# Patient Record
Sex: Male | Born: 1945
Health system: Southern US, Community
[De-identification: ages and names within clinical notes are randomized; demographics above are authoritative.]

## PROBLEM LIST (undated history)

## (undated) DIAGNOSIS — I2510 Atherosclerotic heart disease of native coronary artery without angina pectoris: Secondary | ICD-10-CM

## (undated) DIAGNOSIS — I639 Cerebral infarction, unspecified: Secondary | ICD-10-CM

## (undated) DIAGNOSIS — G709 Myoneural disorder, unspecified: Secondary | ICD-10-CM

## (undated) DIAGNOSIS — E785 Hyperlipidemia, unspecified: Secondary | ICD-10-CM

## (undated) DIAGNOSIS — M1990 Unspecified osteoarthritis, unspecified site: Secondary | ICD-10-CM

## (undated) DIAGNOSIS — K219 Gastro-esophageal reflux disease without esophagitis: Secondary | ICD-10-CM

## (undated) DIAGNOSIS — I10 Essential (primary) hypertension: Secondary | ICD-10-CM

## (undated) HISTORY — DX: Atherosclerotic heart disease of native coronary artery without angina pectoris: I25.10

## (undated) HISTORY — DX: Hyperlipidemia, unspecified: E78.5

## (undated) HISTORY — PX: CORONARY ANGIOPLASTY: SHX604

## (undated) HISTORY — PX: KNEE ARTHROSCOPY: SHX127

## (undated) HISTORY — DX: Essential (primary) hypertension: I10

## (undated) HISTORY — PX: CARDIAC CATHETERIZATION: SHX172

## (undated) HISTORY — PX: FOOT SURGERY: SHX648

---

## 2006-03-07 ENCOUNTER — Ambulatory Visit: Payer: Self-pay | Admitting: Cardiovascular Disease

## 2006-03-11 ENCOUNTER — Ambulatory Visit (HOSPITAL_COMMUNITY): Admission: RE | Admit: 2006-03-11 | Discharge: 2006-03-12 | Payer: Self-pay | Admitting: Cardiovascular Disease

## 2006-03-11 ENCOUNTER — Ambulatory Visit: Payer: Self-pay | Admitting: Cardiovascular Disease

## 2006-03-20 ENCOUNTER — Ambulatory Visit: Payer: Self-pay | Admitting: Cardiovascular Disease

## 2006-09-20 ENCOUNTER — Ambulatory Visit: Payer: Self-pay | Admitting: Cardiovascular Disease

## 2007-01-06 ENCOUNTER — Ambulatory Visit: Payer: Self-pay

## 2007-01-06 ENCOUNTER — Ambulatory Visit: Payer: Self-pay | Admitting: Cardiovascular Disease

## 2007-03-03 ENCOUNTER — Ambulatory Visit: Payer: Self-pay | Admitting: Cardiovascular Disease

## 2007-03-03 LAB — CONVERTED CEMR LAB
ALT: 33 units/L (ref 0–53)
Albumin: 4.3 g/dL (ref 3.5–5.2)
Cholesterol: 238 mg/dL (ref 0–200)
Direct LDL: 142.9 mg/dL
Total Bilirubin: 1 mg/dL (ref 0.3–1.2)
Total CK: 58 units/L (ref 7–195)
Total Protein: 8.3 g/dL (ref 6.0–8.3)
Triglycerides: 129 mg/dL (ref 0–149)
VLDL: 26 mg/dL (ref 0–40)

## 2007-03-17 ENCOUNTER — Ambulatory Visit: Payer: Self-pay | Admitting: Podiatry

## 2007-03-21 ENCOUNTER — Ambulatory Visit: Payer: Self-pay | Admitting: Podiatry

## 2007-07-22 ENCOUNTER — Ambulatory Visit: Payer: Self-pay | Admitting: Cardiovascular Disease

## 2007-07-22 LAB — CONVERTED CEMR LAB
ALT: 23 units/L (ref 0–53)
AST: 25 units/L (ref 0–37)
Albumin: 3.9 g/dL (ref 3.5–5.2)
Total CHOL/HDL Ratio: 6.1
Total Protein: 7.5 g/dL (ref 6.0–8.3)
VLDL: 34 mg/dL (ref 0–40)

## 2007-07-24 ENCOUNTER — Ambulatory Visit: Payer: Self-pay | Admitting: Cardiovascular Disease

## 2008-02-27 ENCOUNTER — Ambulatory Visit: Payer: Self-pay | Admitting: Cardiovascular Disease

## 2008-02-27 LAB — CONVERTED CEMR LAB
ALT: 18 units/L (ref 0–53)
Cholesterol: 272 mg/dL (ref 0–200)
Total Protein: 7.4 g/dL (ref 6.0–8.3)
VLDL: 17 mg/dL (ref 0–40)

## 2008-03-03 ENCOUNTER — Ambulatory Visit: Payer: Self-pay

## 2008-03-03 ENCOUNTER — Ambulatory Visit: Payer: Self-pay | Admitting: Cardiovascular Disease

## 2008-06-04 ENCOUNTER — Ambulatory Visit: Payer: Self-pay | Admitting: Cardiovascular Disease

## 2008-06-04 LAB — CONVERTED CEMR LAB
Alkaline Phosphatase: 45 units/L (ref 39–117)
Total Bilirubin: 0.8 mg/dL (ref 0.3–1.2)
Total CHOL/HDL Ratio: 4.1
Total Protein: 7.9 g/dL (ref 6.0–8.3)
Triglycerides: 97 mg/dL (ref 0–149)

## 2008-07-24 DIAGNOSIS — I2510 Atherosclerotic heart disease of native coronary artery without angina pectoris: Secondary | ICD-10-CM | POA: Insufficient documentation

## 2008-07-24 DIAGNOSIS — E785 Hyperlipidemia, unspecified: Secondary | ICD-10-CM | POA: Insufficient documentation

## 2008-07-24 DIAGNOSIS — I10 Essential (primary) hypertension: Secondary | ICD-10-CM

## 2008-09-02 ENCOUNTER — Ambulatory Visit: Payer: Self-pay | Admitting: Cardiovascular Disease

## 2008-09-10 LAB — CONVERTED CEMR LAB
AST: 24 units/L (ref 0–37)
Albumin: 4 g/dL (ref 3.5–5.2)
Alkaline Phosphatase: 36 units/L — ABNORMAL LOW (ref 39–117)
HDL: 55.7 mg/dL (ref >39.00)
Triglycerides: 124 mg/dL (ref 0.0–149.0)

## 2008-09-17 ENCOUNTER — Ambulatory Visit: Payer: Self-pay | Admitting: Cardiovascular Disease

## 2008-10-20 ENCOUNTER — Telehealth (INDEPENDENT_AMBULATORY_CARE_PROVIDER_SITE_OTHER): Payer: Self-pay | Admitting: *Deleted

## 2009-04-29 ENCOUNTER — Ambulatory Visit: Payer: Self-pay | Admitting: Cardiovascular Disease

## 2009-04-29 ENCOUNTER — Telehealth: Payer: Self-pay | Admitting: Cardiovascular Disease

## 2009-05-02 ENCOUNTER — Telehealth (INDEPENDENT_AMBULATORY_CARE_PROVIDER_SITE_OTHER): Payer: Self-pay | Admitting: *Deleted

## 2009-05-03 ENCOUNTER — Ambulatory Visit: Payer: Self-pay | Admitting: Cardiovascular Disease

## 2009-05-03 ENCOUNTER — Encounter (HOSPITAL_COMMUNITY): Admission: RE | Admit: 2009-05-03 | Discharge: 2009-07-13 | Payer: Self-pay | Admitting: Cardiovascular Disease

## 2009-05-03 ENCOUNTER — Ambulatory Visit: Payer: Self-pay

## 2009-05-12 ENCOUNTER — Ambulatory Visit: Payer: Self-pay | Admitting: Cardiovascular Disease

## 2009-06-13 ENCOUNTER — Telehealth: Payer: Self-pay | Admitting: Cardiovascular Disease

## 2009-08-01 ENCOUNTER — Telehealth: Payer: Self-pay | Admitting: Cardiovascular Disease

## 2009-08-05 ENCOUNTER — Telehealth (INDEPENDENT_AMBULATORY_CARE_PROVIDER_SITE_OTHER): Payer: Self-pay | Admitting: *Deleted

## 2009-09-20 ENCOUNTER — Ambulatory Visit: Payer: Self-pay | Admitting: Cardiovascular Disease

## 2009-10-02 DIAGNOSIS — I6529 Occlusion and stenosis of unspecified carotid artery: Secondary | ICD-10-CM

## 2009-10-06 ENCOUNTER — Telehealth (INDEPENDENT_AMBULATORY_CARE_PROVIDER_SITE_OTHER): Payer: Self-pay | Admitting: *Deleted

## 2009-10-31 ENCOUNTER — Telehealth: Payer: Self-pay | Admitting: Cardiovascular Disease

## 2009-11-18 ENCOUNTER — Telehealth: Payer: Self-pay | Admitting: Cardiovascular Disease

## 2010-03-23 ENCOUNTER — Encounter: Payer: Self-pay | Admitting: Cardiovascular Disease

## 2010-03-24 ENCOUNTER — Ambulatory Visit: Payer: Self-pay

## 2010-03-24 ENCOUNTER — Encounter: Payer: Self-pay | Admitting: Cardiovascular Disease

## 2010-05-09 NOTE — Progress Notes (Signed)
Summary: Tingling  Phone Note Call from Patient Call back at Work Phone [phone]   Caller: Patient Reason for Call: Talk to Nurse Summary of Call: request to speak to nurse Initial call taken by: Migdalia Dk,  June 13, 2009 10:40 AM  Follow-up for Phone Call        I spoke with the pt and he is still having episodes of tingling in his face and left arm.  The pt has now started having episodes when his left leg goes numb, "feels paralyzed".  The pt asked what he should do about these symptoms since he has had a CT and Myoview and these were normal.  I asked the pt to call his primary MD for further evaluation.  I did ask the pt if he had any trauma to his neck or spine and he said no. But the pt has noticed that an area in his neck is painful. The pt agrees to call his primary MD.  Follow-up by: Julieta Gutting, RN, BSN,  June 13, 2009 12:26 PM

## 2010-05-09 NOTE — Assessment & Plan Note (Signed)
Summary: f1y   Visit Type:  Follow-up Primary Provider:  Dr Lorin Picket  CC:  f1y/pt states he has been feeling well. No cardiac concerns at this time.Christopher Perez  History of Present Illness: Mr. Wickens is a 65 year old gentleman presenting for follow-up management of his coronary artery disease.  He initially presented with class III anginal symptoms and had a Myoview scan that demonstrated a large area of inferolateral ischemia.  He underwent stenting of severe stenosis in the OM branch of the circumflex back in 2007 with a drug-eluting stent.  He has done well since his initial PCI procedure and had only mild nonobstructive disease in his other coronary arteries.   He has no complaints today. The patient denies chest pain, dyspnea, orthopnea, PND, edema, palpitations, lightheadedness, or syncope. He is statin-intolerant after trying multiple agents.     Current Medications (verified): 1)  Plavix 75 Mg Tabs (Clopidogrel Bisulfate) .... Take One Tablet By Mouth Daily 2)  Metoprolol Succinate 50 Mg Xr24h-Tab (Metoprolol Succinate) .... Take 1/2 Tablet Two Times A Day 3)  Livalo 2 Mg Tabs (Pitavastatin Calcium) .... Pt Only Taking 1 Tablet A Couple of Days Per Week Due To Side Effects 4)  Nitroglycerin 0.4 Mg Subl (Nitroglycerin) .... One Tablet Under Tongue Every 5 Minutes As Needed For Chest Pain---May Repeat Times Three 5)  Omega-3 Fish Oil 1000 Mg Caps (Omega-3 Fatty Acids) .... Take 1 Capsule By Mouth Once A Day 6)  Vitamin C 500 Mg  Tabs (Ascorbic Acid) .... Take 1 Tablet By Mouth Once A Day 7)  Diovan 80 Mg Tabs (Valsartan) .... Take One Half Tablet Once Daily 8)  Diovan Hct 80-12.5 Mg Tabs (Valsartan-Hydrochlorothiazide) .... Pt Due To Take 1/2 To 1 Tablet Daily When He Finishes Up Diovan 40  Allergies (verified): No Known Drug Allergies  Past History:  Past medical history reviewed for relevance to current acute and chronic problems.  Past Medical History: Reviewed history from 09/17/2008  and no changes required. CAD (ICD-414.00). Pt is s/p PCI of LCx with a drug-eluting in 2007, otherwise nonobstructive disease HYPERTENSION (ICD-401.9) DYSLIPIDEMIA (ICD-272.4)    Review of Systems       Negative except as per HPI   Vital Signs:  Patient profile:   65 year old male Height:      70 inches Weight:      208 pounds BMI:     29.95 Pulse rate:   58 / minute Pulse rhythm:   regular Resp:     18 per minute BP sitting:   150 / 86  (left arm) Cuff size:   regular  Vitals Entered By: Judithe Modest CMA (September 20, 2009 3:54 PM)  Physical Exam  General:  Pt is alert and oriented, in no acute distress. HEENT: normal Neck: normal carotid upstrokes with a right carotid bruit, JVP normal Lungs: CTA CV: RRR without murmur or gallop Abd: soft, NT, positive BS, no bruit, no organomegaly Ext: no clubbing, cyanosis, or edema. peripheral pulses 2+ and equal Skin: warm and dry without rash    EKG  Procedure date:  09/20/2009  Findings:      Sinus bradycardia 58 bpm, otherwise within normal limits.  Impression & Recommendations:  Problem # 1:  CAD (ICD-414.00) The patient is free of angina. He is several years out from PCI and would like to discontinue plavix. This is certainly appropriate based on current guidelines. Recommend ASA 162 mg daily.  The following medications were removed from the medication list:  Plavix 75 Mg Tabs (Clopidogrel bisulfate) .Christopher Perez... Take one tablet by mouth daily    Nitrostat 0.4 Mg Subl (Nitroglycerin) .Christopher Perez... Take as directed His updated medication list for this problem includes:    Metoprolol Succinate 50 Mg Xr24h-tab (Metoprolol succinate) .Christopher Perez... Take 1/2 tablet two times a day    Nitroglycerin 0.4 Mg Subl (Nitroglycerin) ..... One tablet under tongue every 5 minutes as needed for chest pain---may repeat times three    Aspirin 81 Mg Tbec (Aspirin) .Christopher Perez... Take two tablets by mouth daily  Orders: EKG w/ Interpretation (93000)  Problem # 2:   HYPERTENSION (ICD-401.9) BP above goal. Increase diovan to one full tablet daily.  The following medications were removed from the medication list:    Maxzide-25 37.5-25 Mg Tabs (Triamterene-hctz) .Christopher Perez... Take 1/2 tablet by mouth daily    Triamterene-hctz 37.5-25 Mg Tabs (Triamterene-hctz) .Christopher Perez... Take one tablet by mouth daily    Diovan 80 Mg Tabs (Valsartan) .Christopher Perez... Take one half tablet once daily His updated medication list for this problem includes:    Metoprolol Succinate 50 Mg Xr24h-tab (Metoprolol succinate) .Christopher Perez... Take 1/2 tablet two times a day    Diovan Hct 80-12.5 Mg Tabs (Valsartan-hydrochlorothiazide) .Christopher Perez... Take one tablet by mouth daily    Aspirin 81 Mg Tbec (Aspirin) .Christopher Perez... Take two tablets by mouth daily  BP today: 150/86 Prior BP: 128/80 (09/17/2008)  Labs Reviewed: Chol: 203 (09/02/2008)   HDL: 55.70 (09/02/2008)   LDL: DEL (06/04/2008)   TG: 124.0 (09/02/2008)  Problem # 3:  CAROTID ARTERY STENOSIS, WITHOUT INFARCTION (ICD-433.10) Due for follow-up carotid duplex in November 2011. He has a history of mild ICA stenosis and his last ultrasound was done in 2009.  The following medications were removed from the medication list:    Plavix 75 Mg Tabs (Clopidogrel bisulfate) .Christopher Perez... Take one tablet by mouth daily His updated medication list for this problem includes:    Aspirin 81 Mg Tbec (Aspirin) .Christopher Perez... Take two tablets by mouth daily  Patient Instructions: 1)  Your physician has recommended you make the following change in your medication: STOP Plavix, START Aspirin 81mg  take 2 daily, Take a full Diovan HCT everyday 2)  Your physician wants you to follow-up in: 1 YEAR.   You will receive a reminder letter in the mail two months in advance. If you don't receive a letter, please call our office to schedule the follow-up appointment. 3)  Your physician has requested that you have a carotid duplex in November. This test is an ultrasound of the carotid arteries in your neck. It looks at  blood flow through these arteries that supply the brain with blood. Allow one hour for this exam. There are no restrictions or special instructions.

## 2010-05-09 NOTE — Progress Notes (Signed)
Summary: stop plavix, start baby asa  Phone Note Call from Patient Call back at Home Phone [phone] Call back at Work Phone [phone]   Caller: Patient Reason for Call: Talk to Nurse Summary of Call: pt wants to know when can he stop plavix and start taken baby asa Initial call taken by: Lorne Skeens,  August 01, 2009 3:54 PM  Follow-up for Phone Call        Left message to call back. Julieta Gutting, RN, BSN  August 01, 2009 6:20 PM  I spoke with the pt and he is wondering if he can stop plavix at this time.  The pt currently does not take ASA.  The pt would like to stop plavix to help save money.  I told the pt that I would discuss this with Dr Excell Seltzer and then he said he had an appt in June and could wait until that appt to discuss stopping plavix.   Follow-up by: Julieta Gutting, RN, BSN,  August 02, 2009 11:59 AM

## 2010-05-09 NOTE — Progress Notes (Signed)
Summary: REFILL PLAVIX  Phone Note Refill Request Call back at Home Phone [phone] Message from:  Patient on August 05, 2009 4:21 PM  Refills Requested: Medication #1:  PLAVIX 75 MG TABS Take one tablet by mouth daily [facility] [phone]   Method Requested: Fax to Mail Away Pharmacy Initial call taken by: Lorne Skeens,  August 05, 2009 4:21 PM  Follow-up for Phone Call        Rx faxed to pharmacy [facility] 90 X 0 REFILLS Follow-up by: Oswald Hillock,  August 05, 2009 4:57 PM    Prescriptions: PLAVIX 75 MG TABS (CLOPIDOGREL BISULFATE) Take one tablet by mouth daily  #90 x 0   Entered by:   Oswald Hillock   Authorized by:   Norva Karvonen, MD   Signed by:   Oswald Hillock on 08/05/2009   Method used:   Faxed to ...       [facility])             , [place]         Ph: [phone]       Fax: [phone]   RxID:   [id number]

## 2010-05-09 NOTE — Progress Notes (Signed)
Summary: refill request  Phone Note Refill Request   Refills Requested: Medication #1:  DIOVAN HCT 80-12.5 MG TABS take one tablet by mouth daily [facility]   Method Requested: Electronic Initial call taken by: Glynda Jaeger,  October 06, 2009 12:34 PM Reason for Call: Talk to Nurse Summary of Call: pt needs you to [facility] an [facility] for diovan  Initial call taken by: Glynda Jaeger,  October 06, 2009 12:33 PM  Follow-up for Phone Call        Rx faxed to pharmacy Follow-up by: Vikki Ports,  October 06, 2009 3:13 PM    Prescriptions: DIOVAN HCT 80-12.5 MG TABS (VALSARTAN-HYDROCHLOROTHIAZIDE) take one tablet by mouth daily  #90 x 3   Entered by:   Vikki Ports   Authorized by:   Norva Karvonen, MD   Signed by:   Vikki Ports on 10/06/2009   Method used:   Faxed to ...       [facility])             , [place]         Ph: [phone]       Fax: [phone]   RxID:   [id number]

## 2010-05-09 NOTE — Progress Notes (Signed)
Summary: refill  Phone Note Refill Request Message from:  Patient on October 31, 2009 8:14 AM  Refills Requested: Medication #1:  Dicyclomine 20mg  tabs [facility] [phone]  Initial call taken by: Judie Grieve,  October 31, 2009 8:16 AM Caller: Patient  Follow-up for Phone Call        I spoke with the pt and made him aware this is a medication that should be filled by a PCP or GI physician not a Cardiologist.   Follow-up by: Julieta Gutting, RN, BSN,  November 01, 2009 8:59 AM

## 2010-05-09 NOTE — Progress Notes (Signed)
Summary: numbness  Phone Note Call from Patient Call back at Work Phone [phone]   Caller: Patient Reason for Call: Talk to Nurse Summary of Call: having numbness in face and left hand, going on for about 4-5 days...comes and goes Initial call taken by: Migdalia Dk,  April 29, 2009 9:33 AM  Follow-up for Phone Call        I called and spoke with the pt. He is being seen at the [facility] clinic in [place] now.  Follow-up by: Sherri Rad, RN, BSN,  April 29, 2009 10:33 AM

## 2010-05-09 NOTE — Progress Notes (Signed)
Summary: Nuclear Pre-Procedure  Phone Note Outgoing Call Call back at [facility] Phone [phone]   Call placed by: Stanton Kidney, EMT-P,  May 02, 2009 3:17 PM Action Taken: Phone Call Completed Summary of Call: Left message with information on Myoview Information Sheet (see scanned document for details).     Nuclear Med Background Indications for Stress Test: Evaluation for Ischemia, Stent Patency   History: Heart Catheterization, Myocardial Perfusion Study, Stents  History Comments: 11/07 MPS: (+) ischemia, EF= 54% 12/07 Heart Cath > Stent: CFX     Nuclear Pre-Procedure Cardiac Risk Factors: History of Smoking, Hypertension, Lipids Height (in): 70

## 2010-05-09 NOTE — Assessment & Plan Note (Signed)
Summary: Cardiology Nuclear Study  Nuclear Med Background Indications for Stress Test: Evaluation for Ischemia, Stent Patency   History: Heart Catheterization, Myocardial Perfusion Study, Stents  History Comments: 11/07 MPS: (+) ischemia, EF= 54% 12/07 Heart Cath > Stent: CFX  Symptoms: Chest Pressure, Palpitations    Nuclear Pre-Procedure Cardiac Risk Factors: History of Smoking, Hypertension, Lipids Caffeine/Decaff Intake: None NPO After: 7:00 AM Lungs: clear IV 0.9% NS with Angio Cath: 20g     IV Site: (R) AC IV Started by: Irean Hong RN Chest Size (in) 46     Height (in): 70 Weight (lb): 206 BMI: 29.66  Nuclear Med Study 1 or 2 day study:  1 day     Stress Test Type:  Stress Reading MD:  Charlton Haws, MD     Referring MD:  Raynelle Bring Resting Radionuclide:  Technetium 63m Tetrofosmin     Resting Radionuclide Dose:  11.0 mCi  Stress Radionuclide:  Technetium 107m Tetrofosmin     Stress Radionuclide Dose:  33.0 mCi   Stress Protocol Exercise Time (min):  7:30 min     Max HR:  142 bpm     Predicted Max HR:  157 bpm  Max Systolic BP: 226 mm Hg     Percent Max HR:  90.45 %     METS: 9.3 Rate Pressure Product:  16109    Stress Test Technologist:  Milana Na EMT-P     Nuclear Technologist:  Burna Mortimer Deal RT-N  Rest Procedure  Myocardial perfusion imaging was performed at rest 45 minutes following the intravenous administration of Myoview Technetium 74m Tetrofosmin.  Stress Procedure  The patient exercised for 7:30. The patient stopped due to fatigue and denied any chest pain.  There were no significant ST-T wave changes and occ pvcs; Cuplets.  Myoview was injected at peak exercise and myocardial perfusion imaging was performed after a brief delay.  QPS Raw Data Images:  Normal; no motion artifact; normal heart/lung ratio. Stress Images:  NI: Uniform and normal uptake of tracer in all myocardial segments. Rest Images:  Normal homogeneous uptake in all areas of the  myocardium. Subtraction (SDS):  Normal Transient Ischemic Dilatation:  .97  (Normal <1.22)  Lung/Heart Ratio:  .30  (Normal <0.45)  Quantitative Gated Spect Images QGS EDV:  96 ml QGS ESV:  38 ml QGS EF:  61 % QGS cine images:  normal  Findings Normal nuclear study      Overall Impression  Exercise Capacity: Fair exercise capacity. BP Response: Hypertensive blood pressure response. Clinical Symptoms: No chest pain ECG Impression: No significant ST segment change suggestive of ischemia. Overall Impression: Normal stress nuclear study. Overall Impression Comments: Hypertensive response to exercise

## 2010-05-09 NOTE — Progress Notes (Signed)
Summary: refill request  Phone Note Refill Request Message from:  Patient on November 18, 2009 8:26 AM  toprol xl 50 mg sr 24h  [facility]   Method Requested: Telephone to Pharmacy Initial call taken by: Glynda Jaeger,  November 18, 2009 8:26 AM  Follow-up for Phone Call        Rx faxed to pharmacy. Vikki Ports  November 18, 2009 10:14 AM     Prescriptions: METOPROLOL SUCCINATE 50 MG XR24H-TAB (METOPROLOL SUCCINATE) take 1/2 tablet two times a day  #90 x 3   Entered by:   Vikki Ports   Authorized by:   Norva Karvonen, MD   Signed by:   Vikki Ports on 11/18/2009   Method used:   Faxed to ...       [facility] (mail-[facility])             , [place]         Ph: [phone]       Fax: [phone]   RxID:   [id number]

## 2010-05-11 NOTE — Miscellaneous (Signed)
Summary: Orders Update  Clinical Lists Changes  Orders: Added new Test order of Carotid Duplex (Carotid Duplex) - Signed 

## 2010-07-31 ENCOUNTER — Encounter: Payer: Self-pay | Admitting: Cardiovascular Disease

## 2010-08-22 NOTE — Assessment & Plan Note (Signed)
[facility]                        [place] CARDIOLOGY OFFICE NOTE   Christopher Perez, VIRGIN                     MRN:          [id number]  DATE:04/29/2009                            DOB:          January 29, 1946    HISTORY OF PRESENT ILLNESS:  Christopher Perez is a 65 year old white male  with past medical history significant for coronary artery disease status  post stenting with a severe stenosis in an obtuse marginal in 2007 with  a drug-eluting stent, hypertension, and hyperlipidemia.  The patient has  been followed regularly by Dr. Excell Seltzer and has done well.  He has not had  any recurrence of chest discomfort and has been compliant with his  medications with the exception of statins which have caused severe  myalgias.  The patient states that for the past 4-5 days, he has had  intermittent tingling of the fingers in his left hand and in his lips.  He states that it has been occurring several times a day for the past 4-  5 days.  It is not exertional, in fact it occurs more often at rest.  It  lasts a few minutes and then resolves.  There is no discomfort or  numbness, just tingling specifically in the fingers of his left hand and  in his lips.  He denies any history of a carpal tunnel or cold sores,  although he does state that he bit his lip pretty hard several weeks  ago.  The tingling sensation has no associated symptoms, although he  feels sometimes as if he has had might break out into a sweat during it,  but never does.   PHYSICAL EXAMINATION:  VITAL SIGNS:  Blood pressure is 173/103 in the  right arm, 173/100 in the left arm.  Pulse is 67, sating 97% on room  air, and he weighs 217 pounds.  GENERAL:  He is in no acute distress.  HEENT:  Normocephalic, atraumatic.  Cranial nerves II-XII are intact.  Oropharynx without lesion.  NECK:  Supple.  There is no JVD.  There are no carotid bruits.  HEART:  Regular rate and rhythm without murmur, rub or  gallop.  LUNGS:  Clear bilaterally.  ABDOMEN:  Soft, nontender, nondistended.  EXTREMITIES:  Without edema.  SKIN:  Warm and dry.  NEURO:  Nonfocal.  MUSCULOSKELETAL:  The patient has 5/5 bilateral upper and lower  extremity strength.   EKG taken today in the office demonstrates normal sinus rhythm,  nonspecific ST-segment abnormalities.   ASSESSMENT:  A 65 year old male with known coronary artery disease  presenting with a very atypical symptoms.  The tingling in the lips and  fingers would be a very atypical presentation for angina and also would  be atypical distribution for transient ischemic attack.  Viral or other  neuropathic etiology is probably more likely.   PLAN:  We will check a host of labs including a troponin today.  We will  check a noncontrasted head CT scan today to rule out any obvious  intracranial pathology.  We will plan on a stress test early next week.  We will give the patient  a prescription for Maxzide for improved  hypertension control and ask him to start taking a baby aspirin in  addition to the Plavix that he takes at home.  We will also give him  prescription for nitroglycerin.  Over the weekend if he were to  experience  any onset of chest discomfort or if the brief intermittent tingling  episodes were to evolve, to become longer or more severe, we would  recommend that he proceed to the emergency room or call 911.     Brayton El, MD  Electronically Signed    SGA/MedQ  DD: 04/29/2009  DT: 04/30/2009  Job #: [id number]

## 2010-08-22 NOTE — Assessment & Plan Note (Signed)
[facility] CARDIOLOGY OFFICE NOTE   Christopher Perez, Christopher Perez                     MRN:          [id number]  DATE:05/12/2009                            DOB:          1946-03-13    PROBLEM LIST:  1. Coronary artery disease, status post PCI of the OM in 2007 with ES.  2. Hypertension.  3. Hyperlipidemia.   GENERAL HISTORY:  The patient states since his last visit, he has only  had a couple episodes of tingling around his mouth and in his left hand.  This is a significant improvement.  He also denies any chest discomfort  and states that he has been attempting to walk during all of his lunch  hours for exercise.  He has been compliant with his medications and  brings with him a blood pressure log that denotes most systolic blood  pressures that are above 100 or that are in the 140s.   PHYSICAL EXAMINATION:  VITAL SIGNS:  Today, blood pressure is 145/83,  pulse 67, sating 97% on room air, and he weighs 212 pounds which is 5  pounds less than he weighed a week and half ago.  GENERAL:  He is in no acute distress.  HEENT:  Normocephalic, atraumatic.  NECK:  Supple.  No JVD.  HEART:  Regular rate and rhythm without murmur, rub, or gallop.  LUNGS:  Clear bilaterally.  ABDOMEN:  Soft and nontender.  EXTREMITIES:  Without edema.  MUSCULOSKELETAL:  Bilateral upper and lower extremity strength 5/5.  PSYCHIATRIC:  The patient is appropriate with normal levels of insight.   Review of stress test, the patient had dated January 25, he exercised to  9.3 METS and there were no perfusion defects on the nuclear scan.  His  EF was 61%.  Review of the noncontrasted head CT, there were no acute  intracranial abnormalities.  Review of the patient's labs dated April 29, 2009, CMP was within normal limits except for a calcium that was  mildly elevated at 10.4.  His troponin was 0.  His CK was 24, TSH was  2.86.  CBC was within normal  limits with a hemoglobin of 50 and a  platelet count of 253.   ASSESSMENT AND PLAN:  1. Perioral tingling and left hand tingling.  These symptoms have      almost completely resolved.  The etiology of them is unclear, but I      suspect potential viral etiology.  Should these symptoms reoccur,      we would consider carotid ultrasound at this time.  However, he      states that he had carotid ultrasound within the last 2 years that      showed no blockage.  2. Coronary artery disease.  The patient does not have any signs of      angina.  He expresses a desire to perhaps switch from Plavix to      aspirin, and I see no reason why this should not be done.  Once he      stops taking the Plavix, he will start aspirin 162 mg  daily.  He      should continue on beta-blocker.  He has had an intolerance to      statins in the past.  3. Hypertension.  Blood pressure is still not under great control.  We      will increase his Maxzide to a full pill of 37.5/25 mg daily.  In      addition to the Toprol he is taking.  In the future, it is likely      the Toprol will all seem to be titrated up.  4. Hyperlipidemia.  The patient has statin intolerance, but he should      continue taking the Omega-3 fish oil.  He will follow up with Dr.      Excell Seltzer in several months' time for routine cardiovascular      evaluation.     Brayton El, MD  Electronically Signed    SGA/MedQ  DD: 05/12/2009  DT: 05/12/2009  Job #: [id number]

## 2010-08-22 NOTE — Assessment & Plan Note (Signed)
[facility] HEALTHCARE                            CARDIOLOGY OFFICE NOTE   Christopher Perez, Christopher Perez                     MRN:          [id number]  DATE:03/03/2008                            DOB:          11/14/1945    REASON FOR VISIT:  Followup CAD and hypercholesterolemia.   HISTORY OF PRESENT ILLNESS:  Christopher Perez is a 65 year old gentleman  with known coronary artery disease who initially presented with class  III anginal symptoms and had a Myoview scan that demonstrated a large  area of inferolateral ischemia.  He underwent stenting of severe  stenosis in the OM branch of the circumflex back in 2007 with a drug-  eluting stent.  He has done well since his initial PCI procedure and had  only mild nonobstructive disease in his other coronary arteries.  He has  been unable to tolerate any STATINS secondary to severe myalgias.  He  has tried multiple statins including low-dose Crestor and Pravachol.  He  is currently taking only Trilipix for his hypercholesterolemia and he  has even had some difficulty with this secondary to foot and lower leg  pain.  He has been walking 1-1/2 to 3 miles daily.  He stays very active  and has no chest pain, dyspnea, orthopnea or PND.  He has been dieting  and has lost 15 pounds since his last office visit.   MEDICATIONS:  1. Plavix 75 mg daily.  2. Aspirin 81 mg daily.  3. Metoprolol succinate 25 mg twice daily.  4. Trilipix 135 mg daily.   ALLERGIES:  NKDA.   PHYSICAL EXAMINATION:  GENERAL:  The patient is alert and oriented.  He  is in no acute distress.  VITAL SIGNS:  Weight 195 pounds, blood pressure 128/80, heart rate 49,  and respiratory rate 16.  HEENT:  Normal.  NECK:  Normal carotid upstrokes.  No bruits.  JVP normal.  LUNGS:  Clear bilaterally.  HEART:  Bradycardic and regular.  No murmurs or gallops.  ABDOMEN:  Soft and nontender.  No organomegaly.  EXTREMITIES:  No clubbing, cyanosis, or edema.  Peripheral  pulses are  intact and equal.  SKIN:  Warm and dry without rash.   EKG shows sinus bradycardia and is otherwise within normal limits.   Carotid duplex scan preliminary report shows stable mild plaque in the  bulbs with bilateral intimal thickening in the common carotid arteries.  There is 0-39% bilateral ICA stenosis with normal velocities.   ASSESSMENT:  1. Coronary artery disease.  The patient remains asymptomatic.      Continue secondary risk reduction measures.  I would like him to      stay on dual antiplatelet therapy with aspirin and Plavix, as he is      tolerating these well and has overall low-bleeding risk.  Continue      beta-blocker.  Unable to up titrate his medicine due to      bradycardia.  2. Dyslipidemia.  His lipids remain markedly elevated.  Cholesterol is      272, triglycerides 86, HDL is 58, and LDL is 175.  His HDLs come  up      nicely with Trilipix, but his LDL was only down from 218 to 175.  I      have asked him to add ezetimibe 10 mg daily to Trilipix.  We will      follow up with lipids and LFTs in 3 months.  I would like to see      him back in clinical followup in 6 months.     Veverly Fells. Excell Seltzer, MD  Electronically Signed    MDC/MedQ  DD: 03/03/2008  DT: 03/03/2008  Job #: [id number]

## 2010-08-22 NOTE — Assessment & Plan Note (Signed)
[facility]ALTHCARE                            CARDIOLOGY OFFICE NOTE   DYLON, CORREA                     MRN:          [id number]  DATE:01/06/2007                            DOB:          1945/10/09    Christopher Perez was seen in followup at the [facility] Cardiology Office  on January 06, 2007.  Christopher Perez is a 65 year old gentleman with  coronary artery disease and stenting of his left circumflex in December  2007, with a TAXUS drug-eluting stent.  He had classic angina and an  abnormal stress test suggestive area of inferolateral ischemia prior to  his intervention.  He has done very well and has been asymptomatic from  a cardiac standpoint since his PCI procedure.  He is not involved in  regular exercise but remains fairly active.  He denies chest pain,  dyspnea, orthopnea, PND, lightheadedness, syncope, or palpitations.  He  has had some leg myalgias with statins but they are improving.  He is  currently on Crestor and is tolerating this medication well.  He has no  other complaints at today's evaluation.   MEDICATIONS:  1. Metoprolol succinate 50 mg twice dysuria.  2. Omeprazole 20 mg twice daily.  3. Plavix 75 mg daily.  4. Crestor 10 mg daily.  5. Aspirin 81 mg daily.  6. Red Yeast Rice daily.   ALLERGIES:  NKDA.   PHYSICAL EXAMINATION:  GENERAL:  The patient is alert and oriented.  He  is in no acute distress.  VITAL SIGNS:  His weight is 208 pounds.  Blood pressure 140/89, heart  rate is 55, respiratory rate is 16.  HEENT:  Normal.  NECK:  Normal carotid upstrokes with a soft right carotid bruit.  Jugular venous pressure is normal.  LUNGS:  Clear to auscultation bilaterally.  HEART:  Regular rate and rhythm without murmurs or gallops.  ABDOMEN:  Soft, nontender.  No organomegaly.  No abdominal bruits.  EXTREMITIES:  No clubbing, cyanosis, or edema.  Peripheral pulses 2 plus  and equal throughout.   ASSESSMENT:  1. Coronary  artery disease.  The patient remains stable with no signs      of angina.  Continue aspirin and Plavix.  Continue aggressive      secondary risk reduction as detailed below.  He is tolerating beta-      blockade well.  No changes in medications today.  2. Dyslipidemia.  Christopher Perez is tolerating low dose Crestor.  He has      just started taking this medicine in the past few weeks.  We will      check lipids, LFTs, and his CK in 8 weeks in the setting of his      resolving myalgias.  3. Hypertension.  His blood pressure is not optimal.  I have suggested      additional a hypertensive medication but he prefers to not start      any new medicines at this point.  He is going to work hard at diet      and exercise to see if we can lower his blood pressure by  lifestyle      modification.   For followup, I will see Christopher Perez back in 6 months and we will  follow up with telephone after his lab work is completed in 8 weeks.     Veverly Fells. Excell Seltzer, MD  Electronically Signed    MDC/MedQ  DD: 01/08/2007  DT: 01/09/2007  Job #: [id number]   cc:   Lucila Maine

## 2010-08-22 NOTE — Assessment & Plan Note (Signed)
[facility]ALTHCARE                            CARDIOLOGY OFFICE NOTE   BRAELON, SPRUNG                     MRN:          [id number]  DATE:09/20/2006                            DOB:          1945/11/23    Legacy Lacivita is a 65 year old gentleman with coronary artery  disease, status post percutaneous intervention of his left circumflex  back in November of last year.  He has done well since his intervention,  and his exertional angina has resolved.  He presents today for followup.  He has had a few twinges of chest pain that are fleeting and  nonexertional.  Otherwise, he has been asymptomatic.  He denies any  dyspnea, orthopnea, PND, edema, lightheadedness, palpitations, or  syncope.  He does complain of erectile dysfunction, which is new since  starting on his cardiac medications.  He has no other complaints today.  Other than working in his garden, he has not been physically active with  a regular exercise program.  He had discontinued Vytorin due to  myalgias, and has been started on pravastatin.   CURRENT MEDICATIONS:  1. Metoprolol 50 mg b.i.d.  2. Omeprazole 20 mg b.i.d. as needed.  3. Plavix 75 mg daily.  4. Pravachol 40 mg at bedtime.  5. Aspirin 325 mg daily.   ALLERGIES:  NO KNOWN DRUG ALLERGIES.   EXAMINATION:  The patient is alert and oriented.  He is in no acute  distress.  Weight is 207 pounds.  Blood pressure is 154/90.  Heart rate is 61.  Blood pressure on my recheck was 140/90.  Respiratory rate 16.  HEENT:  Normal.  NECK:  Normal carotid upstrokes with a soft right carotid bruit.  Jugular venous pressure is normal.  LUNGS:  Clear to auscultation bilaterally.  HEART:  The apex is discrete and nondisplaced.  The heart is regular  rate and rhythm without murmurs or gallops.  ABDOMEN:  Soft.  Obese.  Non-tender.  No organomegaly.  Bowel sounds are  present.  EXTREMITIES:  No clubbing, cyanosis, or edema.  Peripheral pulses  are 2+  and equal throughout.  SKIN:  Warm and dry without rash.   EKG shows normal sinus rhythm, and is within normal limits.   ASSESSMENT:  Mr. Rickel is currently stable from a cardiovascular  standpoint.  His cardiac problems are as follows:  1. Coronary artery disease.  He is having no evidence of angina.  I      encouraged him to try to resume a regular exercise program.      Regarding his antiplatelet therapy, he needs to continue on dual      antiplatelet therapy for a minimum of 1 year.  I would like him to      reduce his aspirin dose to 81 mg, and continue on both aspirin and      clopidogrel.  At 12 months, we will discuss whether to discontinue      clopidogrel or continue on this medication.  There is some debate      regarding clopidogrel duration after 12 months with a drug-eluting  stent.  My general practice has been to continue if the patient is      tolerating it well due to the small chance of very late stent      thrombosis that can occur after 1 year.  2. Hypertension.  His blood pressure is suboptimal.  He is reluctant      to start a new medication, and would like to give a trial of diet      and weight loss.  He has a home blood pressure cuff, and I asked      him to record blood pressures over the next few months.  I would      like to see him back in 3 months to see how he has progressed.  If      his blood pressure readings are within goal, he can continue with      metoprolol, but if not, he will require additional therapy.  He is      agreeable to this plan.  3. Dyslipidemia.  He is currently on Pravachol because of intolerance      to Vytorin.  Dr. Lorin Picket is following his lipids, and increased his      Pravachol from 20 mg to 40 mg approximately 1 month ago.  His goal      LDL is less than 70, but realizing this may not be achievable, as      he has had a very high baseline cholesterol of 329 with an LDL of      214 prior to starting treatment in  November of last year.  I      suspect he will likely require multidrug therapy if he is able to      tolerate, and would even consider a trial of Crestor as he has very      high LDL on Pravachol.  4. Right carotid bruit.  We will schedule him for a carotid ultrasound      at the time of his return visit in 3 months.   For followup, I would like to see Mr. Logue back in 3 months, as  detailed above, with his carotid ultrasound.  We will check on the  progress of his blood pressure control at that point.     Veverly Fells. Excell Seltzer, MD  Electronically Signed    MDC/MedQ  DD: 09/20/2006  DT: 09/20/2006  Job #: [id number]   cc:   Lucila Maine, MD

## 2010-08-22 NOTE — Assessment & Plan Note (Signed)
[facility] HEALTHCARE                            CARDIOLOGY OFFICE NOTE   GARRIN, KIRWAN                     MRN:          [id number]  DATE:07/24/2007                            DOB:          03-19-1946    Christopher Perez was seen in followup at the [facility] Cardiology office on  July 24, 2007.  Christopher Perez is a 65 year old gentleman with coronary  artery disease.  He underwent stenting of his left circumflex in 2007,  with a Taxus drug-eluting stent.  Christopher Perez presented with classic  exertional angina and was found to have a 99% stenosis of the large OM  branch.  He underwent successful stenting and has had an excellent  symptomatic response.  He has no further angina.  He specifically denies  any chest pain, dyspnea, orthopnea, PND, lightheadedness, syncope or  palpitations or edema.  Unfortunately, he has been intolerant to all  Statins.  He has tried pravastatin, simvastatin and rosuvastatin.  He  has severe myalgias and weakness.  He has been on low doses.  He has  been tried on very low doses and still has been unable to tolerate.  His  lipid panel drawn on April 14 showed a cholesterol of 300, triglycerides  170, HDL 49, LDL 218.   MEDICATIONS:  Include:  1. Plavix 75 mg daily.  2. Aspirin 81 mg daily.  3. Metoprolol 50 mg b.i.d.   ALLERGIES:  NKDA.   EXAMINATION:  GENERAL:  On exam, she is alert and oriented, no distress.  VITAL SIGNS:  Weight is 210, blood pressure is 128/80, heart rate 61,  respiratory rate 12.  HEENT:  Normal.  NECK:  Normal carotid upstrokes without bruits.  Jugular venous pressure  is normal.  LUNGS:  Clear bilaterally.  HEART:  Regular rate and rhythm without murmurs or gallops.  ABDOMEN:  Soft, nontender no organomegaly, obese.  EXTREMITIES:  No clubbing, cyanosis or edema.  Peripheral pulses 2+ and  equal throughout.   EKG shows sinus bradycardia and is otherwise within normal limits.   ASSESSMENT:  1.  Coronary artery disease.  Status post stenting of the left      circumflex with a Taxus drug-eluting stent.  Continue dual      antiplatelet therapy with aspirin and Plavix.  No symptoms of      angina at present.  Encouraged increased activity and exercise.  2. Dyslipidemia.  Christopher Perez has markedly elevated lipids.  He      really needs treatment but unfortunately cannot take Statins.      Initiate Trilipix.  Repeat lipids and liver panel will be checked      in 3 months.  If he is tolerating Trilipix, will likely add Zetia      to be used in combination.   For followup, I would like to see Christopher Perez in 6 months.     Veverly Fells. Excell Seltzer, MD  Electronically Signed    MDC/MedQ  DD: 07/24/2007  DT: 07/24/2007  Job #: [id number]   cc:   Lucila Maine, M.D.

## 2010-08-25 NOTE — Cardiovascular Report (Signed)
NAME:  Christopher Perez, Christopher Perez NO.:  [id number]   MEDICAL RECORD NO.:  [id number]          PATIENT TYPE:  OIB   LOCATION:  2852                         FACILITY:  [facility]   PHYSICIAN:  Veverly Fells. Excell Seltzer, MD  DATE OF BIRTH:  February 10, 1946   DATE OF PROCEDURE:  03/11/2006  DATE OF DISCHARGE:                            CARDIAC CATHETERIZATION   PROCEDURE:  Left heart catheterization, selective coronary angiography,  left ventricular angiography, percutaneous transluminal coronary  angioplasty and drug-eluting stent placement of the second obtuse  marginal branch of the left circumflex, Angio-Seal of the right femoral  artery.   INDICATIONS:  Christopher Perez is a very nice 65 year old gentleman who  presented to my office last week in consultation from Dr. Lucila Maine.  He has classic exertional angina, CCS class III.  He underwent an  exercise Cardiolite study that demonstrated a large area of  inferolateral ischemia.  He was subsequently referred for cardiac  catheterization.   Procedural details, risks and indications of the procedure were  explained in detail to the patient.  Informed consent was obtained.  The  right groin was prepped, draped and anesthetized with 1% lidocaine under  normal conditions.  Using modified Seldinger technique, a 6-French  sheath was placed in the right femoral artery.  Multiple views of the  left right coronary arteries were taken with a 6-French JL-4 catheter  and 6-French JR-4 catheter.  There was some spasm of the right coronary  artery and a 4-French 3-D RC catheter was used.  An angled pigtail  catheter was inserted into the left ventricle, and left ventricular  pressures were recorded.  A 30-degrees RAO left ventriculogram was done.  A pullback across the aortic valve was performed.   Following the diagnostic portion of the procedure, PCI was performed on  a 99% stenosis of a large second obtuse marginal branch of the left  circumflex.   The patient was pretreated with clopidogrel after seeing me  in the office last week.  He has continued his clopidogrel throughout.  Therefore, Angiomax was used for anticoagulation.  Once therapeutic ACT  was achieved, a 6-French XB 3.5-mm guiding catheter was inserted.  A BMW  guidewire was used to cross the lesion.  A 2.5 x 15-mm maverick balloon  was inflated to 10 atmospheres to treat the lesion.  Following balloon  dilatation, there was TIMI 3 flow in the vessel.  The lesion was just  beyond the true circumflex, and I did not think that it would be  treatable unless the stent was brought back proximal to the bifurcation.  Therefore, I used a 3 x 20-mm Taxus stent which went from the mid  circumflex down beyond the lesion in the second obtuse marginal.  The  stent was deployed at 12 atmospheres.  Following stent deployment, there  remained excellent side branch flow in the true circumflex, and the  stent was well expanded in the obtuse marginal.  The stent was post-  dilated with a 3.25 x 15-mm Quantum Maverick balloon up to 20  atmospheres on 2 subsequent inflations.  Following stent deployment and  post-dilatation, there  was TIMI 3 flow in the main branch.  The true  circumflex branch that the stent crossed initially exhibited TIMI 2 to 3  flow, but after intracoronary nitroglycerin, the flow normalized.  The  patient had mild chest pain that resolved by the completion of the  procedure.   At the conclusion of the intervention, a 6-French Angio-Seal was used to  seal the arteriotomy.   FINDINGS:  Aortic pressure 124/68 with a mean of 91, left ventricular  pressure 129/3 with an end-diastolic pressure of 10.   CORONARY ANGIOGRAPHY:  Left mainstem is angiographically normal,  bifurcates into the LAD and left circumflex.   LAD is a large-caliber vessel that courses down into the left  ventricular apex.  Gives off a large first diagonal branch and a small  second diagonal.  There  is nonobstructive disease in the proximal LAD at  approximately 30% stenosis.  The remainder of the vessel is free of any  significant angiographic disease.   The left circumflex is a large caliber vessel.  It gives off a very  small first obtuse marginal and a large second OM.  The large second OM  branch has a 99% proximal stenosis in that vessel.  Just proximal to the  stenosis, the true circumflex courses down and gives off a left  posterolateral branch as well as there is also a left AV-nodal artery.  The true circumflex has 40% ostial disease just beyond the second obtuse  marginal, and it is otherwise free of any significant angiographic  disease.   Right coronary artery is codominant.  There is a 20% proximal stenosis  and 30% mid stenosis.  It gives off a small PDA and a left  posterolateral branch that are free of any significant angiographic  disease.   Left ventricular function is normal.  EF 55%.   ASSESSMENT:  1. Severe single-vessel coronary artery disease.  2. Nonobstructive disease in the left anterior descending and right      coronary artery.  3. Normal left ventricular function.   PLAN:  As described above, PCI of the second obtuse marginal branch was  performed with a 3 x 20 Taxus stent with an excellent angiographic  result.  The patient should continue on aspirin and clopidogrel for a  minimum of 12 months.  He has no other significant disease but will  require aggressive medical therapy to prevent progression of his other  nonobstructive disease.      Veverly Fells. Excell Seltzer, MD  Electronically Signed     MDC/MEDQ  D:  03/11/2006  T:  03/12/2006  Job:  [id number]   cc:   Maryella Shivers. Lorin Picket, M.D.

## 2010-08-25 NOTE — Letter (Signed)
July 19, 2008     RE:  EBERT, FORRESTER  MRN:  [id number]  /  DOB:  03/18/1946   To Whom It May Concern:   Christopher Perez is a 65 year old gentleman with a diagnosis of coronary  artery disease.  He presented back on March 11, 2006 with new-onset  angina.  His cardiac catheterization showed severe stenosis of the left  circumflex artery which was treated with a stent.  That procedure was  performed March 11, 2006.  The patient continues with periodic follow  up.  Currently, he is followed at 72-month intervals.  At the time of his  last office visit on March 03, 2008, he was on a stable medical  regimen, which included Plavix 75 mg daily, aspirin 81 mg daily,  metoprolol succinate 25 mg twice daily, and Trilipix 135 mg daily.  He  has done well with medical therapy and has had no further symptoms.  He  has not had further stress testing or assessment of his left ventricular  function since his original presentation back in 2007.  Of note, his  LVEF at the time of his cardiac catheterization was normal, estimated at  55%.   If there are any questions, please feel free to contact my office at any  time.      Sincerely,      Veverly Fells. Excell Seltzer, MD  Electronically Signed    MDC/MedQ  DD: 07/19/2008  DT: 07/19/2008  Job #: [id number]

## 2010-08-25 NOTE — Discharge Summary (Signed)
NAME:  Christopher Perez, Christopher Perez NO.:  [id number]   MEDICAL RECORD NO.:  [id number]          PATIENT TYPE:  OIB   LOCATION:  6531                         FACILITY:  [facility]   PHYSICIAN:  Veverly Fells. Excell Seltzer, MD  DATE OF BIRTH:  1945/08/22   DATE OF ADMISSION:  03/11/2006  DATE OF DISCHARGE:  03/12/2006                               DISCHARGE SUMMARY   PRIMARY CARDIOLOGIST:  Veverly Fells. Excell Seltzer, M.D.   PRIMARY CARE PHYSICIAN:  Dr. Lucila Maine at [facility]  in [place].   PRINCIPAL DIAGNOSIS:  Unstable angina, coronary artery disease.   SECONDARY DIAGNOSES:  1. Hyperlipidemia.  2. Remote tobacco abuse, quitting in 1985.   ALLERGIES:  NO KNOWN DRUG ALLERGIES.   PROCEDURES:  Left heart cardiac catheterization with successful PCI and  stenting of the OM-2 with placement of 30 x 20-mm TAXUS drug-eluting  stent.   HISTORY OF PRESENT ILLNESS:  A 65 year old male who saw Dr. Tonny Bollman in the office on March 07, 2006, following an abnormal  exercise Myoview which revealed a large portion of inferolateral  ischemia with an EF 54%.  Decision was made at that time to pursue left  heart cardiac catheterization.   HOSPITAL COURSE:  The patient presented to the [facility] on March 11, 2006, and was taken to the cardiac cath lab where  a catheterization revealed a 99% stenosis in the second obtuse marginal  and otherwise nonobstructive coronary artery disease.  EF was 55%.  He  then underwent successful PCI and stenting of the second obtuse marginal  placement of a 3 x 20-mm TAXUS drug-eluting stent.  He tolerated this  procedure well and post procedure enzymes remained negative.  He has  been ambulating without difficulty and he is being discharged home today  in satisfactory condition.   DISCHARGE LABORATORY:  Hemoglobin 14.1, hematocrit 41.3, WBC 7.3,  platelets 277.  Sodium 134, potassium 4, chloride 101, CO2 28, BUN 8,  creatinine  0.9, glucose 106.  CK 30, MB 1, calcium 9.1.   DISPOSITION:  The patient is being discharged home today in good  condition.   FOLLOWUP PLANS AND APPOINTMENTS:  He has followup appointment with Dr.  Tonny Bollman on December 12 at 10 a.m.   DISCHARGE MEDICATIONS:  1. Aspirin 325 mg daily.  2. Plavix 75 mg daily.  3. Metoprolol tartrate 50 mg b.i.d.  4. Omeprazole 20 mg b.i.d.  5. Vytorin 10/20 mg daily.  6. Nitroglycerin 0.4 mg sublingual p.r.n. chest pain.   OUTSTANDING LABORATORY STUDIES:  None.   Duration discharge encounter, 35 minutes including physician time.      Nicolasa Ducking, ANP      Veverly Fells. Excell Seltzer, MD  Electronically Signed    CB/MEDQ  D:  03/12/2006  T:  03/12/2006  Job:  [id number]   cc:   Lucila Maine, Dr.

## 2010-08-25 NOTE — Letter (Signed)
March 20, 2006    Lucila Maine, M.D.  [facility]  [address]  [place], [place] [address]   RE:  DOYT, CASTELLANA  MRN:  [id number]  /  DOB:  [birth year]   Dear Dr. Lorin Picket:   It was my pleasure to see Kolten Ryback back at [facility]  [facility] this morning in follow-up.  As you know, Mr. Knust is a very  nice 65 year old gentleman whom you initially saw for classic exertional  angina and referred to me for further evaluation.  I saw him in the  office on November 29 and in the setting of his classic symptoms, we  elected to go straight to cardiac catheterization for further  evaluation.  His cardiac catheterization demonstrated single-vessel  coronary artery disease with a 99% stenosis of a large obtuse marginal  branch of the left circumflex.  His LAD and right coronary artery had  nonobstructive plaque disease but no significant high-grade stenoses.  He underwent angioplasty and stenting with a drug-eluting stent in that  obtuse marginal branch and did very well with the procedure.  He has  tolerated his medical therapy without problems.   Since discharge he has returned to his regular activity.  He reports  walking 2 miles on flat ground yesterday without any symptoms.  He has  had no angina, dyspnea, orthopnea, PND, palpitations, edema, or other  cardiac complaints.   His current medications include:  1. Metoprolol 50 mg daily.  2. Omeprazole 20 mg twice daily.  3. Vytorin 10/20 mg daily.  4. Aspirin 325 mg daily.  5. Plavix 75 mg daily.   ALLERGIES:  No known drug allergies.   PHYSICAL EXAMINATION:  GENERAL:  Mr. Boulden is alert and oriented.  He  is in no acute distress.  VITAL SIGNS:  His weight is 211 pounds.  Blood pressure is 128/77, heart  rate is 59, respiratory rate is 12.  HEENT:  Normal.  NECK:  Normal carotid upstrokes without bruits.  Jugular venous pressure  is normal.  LUNGS:  Clear to auscultation  bilaterally.  CARDIOVASCULAR:  The apex is discrete and nondisplaced.  The heart is a  regular rate and rhythm without murmurs or gallops.  ABDOMEN:  Soft, nontender, no organomegaly, no abdominal bruits.  EXTREMITIES:  The right groin site is intact.  There is no mass,  ecchymosis or hematoma.  The peripheral pulses are 2+ and equal  throughout.   EKG demonstrates sinus bradycardia and is otherwise within normal  limits.   ASSESSMENT:  Mr. Cruces is currently stable from a cardiovascular  standpoint with regard to his coronary artery disease.  He underwent  recent percutaneous coronary intervention with drug-eluting stent as  described above.  The patient will be continued on dual antiplatelet  therapy with aspirin and Plavix for a minimum of 12 months.  At that  point we can consider discontinuing his Plavix, but he will need aspirin  lifelong.  He is not having any angina at present.  He was not  tolerating his metoprolol at a dose of 50 mg twice daily, and he has cut  this back on his own to once daily.  I have asked him to resume twice  daily dosing but to take a reduced dose at 25 mg twice daily.   Regarding his dyslipidemia, he had markedly elevated cholesterol at your  visit with him with a total cholesterol of 329, LDL of 214, and HDL of  67.  At  that point you started him on Vytorin 10/20 mg.  This was a  recent addition, so he has not had follow-up lipids and LFTs yet.  I  will defer treatment of his dyslipidemia to you at this point.  His goal  LDL is less than 70 in the setting of coronary artery disease.  However,  realistically this will be very difficult to achieve as his starting LDL  was over 200.  I would think that he will require Vytorin at the 10/80  mg dose, but it is certainly reasonable to wait and see how he has  responded with follow-up lipids in 6-8 weeks.   Dr. Lorin Picket, thanks again for allowing me to participate in the care of  Mr. Janee Morn.  Please  feel free to contact me at any time with questions  regarding his care.    Sincerely,     Veverly Fells. Excell Seltzer, MD  Electronically Signed   MDC/MedQ  DD: 03/20/2006  DT: 03/20/2006  Job #: [id number]

## 2010-08-25 NOTE — Letter (Signed)
March 07, 2006    Lucila Maine, MD  [facility]  [address]  [place], [place] [address]   RE:  REMBERT, BROWE  MRN:  [id number]  /  DOB:  Dec 12, 1945   Dear Dr. Lorin Picket:   It was my pleasure to see Christopher Perez this afternoon at the [facility].  As you know, he is a very nice 65 year old gentleman  who presented to your office with classic symptoms of stable angina.  He  reports a 6-week history of exertional chest pain.  He recently had his  most significant episode when he was [place].  He describes substernal chest pain that feels dull and  is associated with dyspnea.  He also feels flushed in the face at the  time of his symptoms.  His symptoms resolved fairly rapidly after  discontinuing activity.  He has not had any resting symptoms.  His pain  is nonradiating.  He has occasional palpitations, but no lightheadedness  or syncope.  He has had no orthopnea, PND or other cardiovascular  complaints.  He denies claudication symptoms.  He tells me that you  started him on a beta blocker approximately 1 week ago and his angina  has been much better since that time.   He underwent an exercise Cardiolite study on February 27, 2006 that  demonstrated ST depressions on his ECG that were associated with  substernal chest pressure.  The nuclear images demonstrated a large  portion of inferolateral ischemia.  His left ventricular ejection  fraction was normal at 54%.   Past medical history is unremarkable, other than dyslipidemia.  He was  just started on Vytorin 10/20 mg daily for high cholesterol.  His recent  lipid profile demonstrated a markedly elevated cholesterol with a total  cholesterol of 329, LDL of 214, HDL of 67 and triglycerides of 241.  The  only other findings in his past medical history are benign polyps and a  pilonidal cyst removal in 1982.   SOCIAL HISTORY:  The patient  works as a Designer, multimedia.  He lives in [place] and is married with 1 grown child.  He is a former  smoker, but quit back in 1985.  He drinks approximately 2 alcoholic  drinks per day.   FAMILY HISTORY:  There is no coronary artery disease in his family.  His  mother died at age 30 of old age and his father died of unknown causes  and his medical history is grossly unknown.   REVIEW OF SYSTEMS:  Complete 12-point review of systems was performed.  Pertinent findings included only questionable gastroesophageal reflux as  well as a remote history of gout.  All other systems were reviewed and  are negative, except as described above.  Specifically, there are no  bleeding disorders.   PHYSICAL EXAMINATION:  The patient is alert and oriented, in no acute  distress.  His weight is 208 pounds.  Blood pressure is 128/78, heart  rate 70, respiratory rate is 12.  HEENT:  Normal.  NECK:  Normal carotid  upstrokes without bruits.  Jugular venous pressure is normal.  There is  no thyromegaly or thyroid nodules.  LUNGS:  Clear to auscultation  bilaterally.  CARDIOVASCULAR:  The apex is discrete and nondisplaced.  There is no right ventricular heave or lift.  The heart is regular rate  and rhythm without murmurs or gallops.  ABDOMEN:  Soft and  nontender.  No organomegaly.  No abdominal bruits.  No rebound or guarding.  MUSCULOSKELETAL:  The spine is midline and gait is normal.  EXTREMITIES:  There is no clubbing, cyanosis, or edema.  Peripheral pulses are 2+ and  equal throughout.  Femoral pulses are 2+ without bruits.  SKIN:  Warm  and dry.  LYMPHATICS:  There is no adenopathy.  NEUROLOGIC:  Grossly  intact.  Strength is 5/5 in the arms and legs.  Cranial nerves II-XII  are intact.   Stress test from November 21 again demonstrated a resting EKG that was  normal sinus rhythm and within normal limits.  The patient exercised for  7 minutes and achieved a peak heart rate of 140, which  was 86% predicted  maximum.  His peak blood pressure was 180/90.  He had chest pressure and  1- to 1.5-mm horizontal ST depression.  His nuclear images were again  positive for a large area of inferolateral ischemia.   ASSESSMENT:  Mr. Rudin has classic exertional angina.  It is  lifestyle-limiting.  He has had some improvement with the initiation of  beta blockers, but still is symptomatic.  I recommended a diagnostic  cardiac catheterization.  I think it is highly likely that he will have  high-grade stenosis in at least 1 of his major epicardial vessels.  I  gave him the option of coming to our outpatient laboratory for a  diagnostic study, or coming in as an outpatient to the main  catheterization laboratory where he could have an ad hoc intervention if  high-grade disease found; he elected the latter option, as he would like  to have his potential percutaneous coronary intervention performed at  the same time as his diagnostic study.  I have elected to start him on  clopidogrel therapy; he was given 150 mg to be taken for the next 2 days  and then 75 mg daily thereafter.  We will schedule him for a heart  catheterization early next week, further recommendations pending the  results of his catheterization.   In the meantime, I agree with continuing him on beta blocker therapy as  well as aspirin and clopidogrel, as above.  He has sublingual  nitroglycerin and will use this on a p.r.n. basis.  He was instructed on  what to do if he gets resting chest pain regarding nitroglycerin as well  as activating EMS if he has resting pain that is refractory to  nitroglycerin.   Dr. Lorin Picket, thanks again for allowing me to evaluate Mr. Janee Morn.  I  will be in contact with you at the time of his catheterization.  Please  feel free to call me at any time with questions regarding his care.    Sincerely,      Veverly Fells. Excell Seltzer, MD  Electronically Signed   MDC/MedQ  DD: 03/07/2006  DT:  03/08/2006  Job #: [id number]

## 2010-10-03 ENCOUNTER — Encounter: Payer: Self-pay | Admitting: Cardiovascular Disease

## 2010-10-03 ENCOUNTER — Ambulatory Visit (INDEPENDENT_AMBULATORY_CARE_PROVIDER_SITE_OTHER): Payer: BC Managed Care – PPO | Admitting: Cardiovascular Disease

## 2010-10-03 DIAGNOSIS — I10 Essential (primary) hypertension: Secondary | ICD-10-CM

## 2010-10-03 DIAGNOSIS — E785 Hyperlipidemia, unspecified: Secondary | ICD-10-CM

## 2010-10-03 DIAGNOSIS — I2510 Atherosclerotic heart disease of native coronary artery without angina pectoris: Secondary | ICD-10-CM

## 2010-10-03 DIAGNOSIS — N529 Male erectile dysfunction, unspecified: Secondary | ICD-10-CM

## 2010-10-03 DIAGNOSIS — I6529 Occlusion and stenosis of unspecified carotid artery: Secondary | ICD-10-CM

## 2010-10-03 MED ORDER — VALSARTAN-HYDROCHLOROTHIAZIDE 80-12.5 MG PO TABS: 1.0000 | ORAL_TABLET | Freq: Every day | ORAL | Status: DC

## 2010-10-03 MED ORDER — METOPROLOL SUCCINATE ER 50 MG PO TB24: 50.0000 mg | ORAL_TABLET | Freq: Every day | ORAL | Status: DC

## 2010-10-03 MED ORDER — TADALAFIL 10 MG PO TABS: 10.0000 mg | ORAL_TABLET | Freq: Every day | ORAL | Status: AC | PRN

## 2010-10-03 NOTE — Assessment & Plan Note (Signed)
The patient reports erectile dysfunction and thinks it is medication related. He was written a prescription for Cialis 10 mg as needed. We discussed a contraindication with use of nitrates and he understands this.

## 2010-10-03 NOTE — Progress Notes (Signed)
HPI:  Mr. Vanwart is a 65 year old gentleman presenting for follow-up management of his coronary artery disease.  He initially presented with class III anginal symptoms and had a Myoview scan that demonstrated a large area of inferolateral ischemia.  He underwent stenting of severe stenosis in the OM branch of the circumflex back in 2007 with a drug-eluting stent.  He has done well since his initial PCI procedure and had only mild nonobstructive disease in his other coronary arteries.   The patient is somewhat limited by low back and hip discomfort. He has been able to resume a walking program of up to 2 miles per day. He denies exertional symptoms. He specifically denies chest pain or pressure. He denies dyspnea, leg edema, palpitations, or lightheadedness. He would like to be able to come off of some medication and he understands that weight loss will be an important part of this.  Outpatient Encounter Prescriptions as of 10/03/2010  Medication Sig Dispense Refill  . Ascorbic Acid (VITAMIN C) 500 MG tablet Take 500 mg by mouth daily.        Marland Kitchen aspirin 81 MG tablet Take 162 mg by mouth daily.        . Coenzyme Q10 Liposomal 100 MG/ML LIQD Take by mouth. 2 tsp       . fish oil-omega-3 fatty acids 1000 MG capsule Take 2 g by mouth daily.        Marland Kitchen ibuprofen (ADVIL,MOTRIN) 200 MG tablet Take 200 mg by mouth every 6 (six) hours as needed.        . Magnesium 200 MG TABS Take by mouth.        . metoprolol (TOPROL-XL) 50 MG 24 hr tablet Take 25 mg by mouth daily.       . valsartan-hydrochlorothiazide (DIOVAN-HCT) 80-12.5 MG per tablet Take 1 tablet by mouth daily.        Marland Kitchen zinc gluconate 50 MG tablet Take 50 mg by mouth daily.        Marland Kitchen DISCONTD: clopidogrel (PLAVIX) 75 MG tablet Take 75 mg by mouth daily.        Marland Kitchen DISCONTD: triamterene-hydrochlorothiazide (MAXZIDE-25) 37.5-25 MG per tablet Take 1 tablet by mouth daily.          No Known Allergies  Past Medical History  Diagnosis Date  . CAD (coronary  artery disease)   . HTN (hypertension)   . HLD (hyperlipidemia)     ROS: Negative except as per HPI  BP 132/83  Pulse 56  Ht 5\' 10"  (1.778 m)  Wt 215 lb 12.8 oz (97.886 kg)  BMI 30.96 kg/m2  PHYSICAL EXAM: Pt is alert and oriented, weight male in NAD HEENT: normal Neck: JVP - normal, carotids 2+= without bruits Lungs: CTA bilaterally CV: RRR without murmur or gallop Abd: soft, obese, NT, Positive BS, no hepatomegaly Ext: no C/C/E, distal pulses intact and equal Skin: warm/dry no rash  EKG: Sinus bradycardia 56 beats per minute, within normal limits.  ASSESSMENT AND PLAN:

## 2010-10-03 NOTE — Assessment & Plan Note (Signed)
The patient is stable without angina. He is on appropriate medical regimen. We discussed the importance of dietary modification and exercise for continued risk reduction related to his coronary artery disease. Plan followup in 12 months.

## 2010-10-03 NOTE — Assessment & Plan Note (Signed)
Blood pressure control. If he is able to lose significant weight and improve his blood pressure, it would be reasonable to discontinue Diovan/HCT, but he is not ready for this yet. Note blood pressure is in the normal range today with antihypertensive therapy

## 2010-10-03 NOTE — Assessment & Plan Note (Signed)
Patient is statin intolerance secondary to leg myalgias. He is followed by Dr. Lorin Picket.

## 2010-10-03 NOTE — Assessment & Plan Note (Signed)
The patient's last carotid duplex scan was reviewed. He had mild disease bilaterally with 40-59% stenosis on the right and 0-39% stenosis on the left. Continue medical management.

## 2010-10-03 NOTE — Patient Instructions (Signed)
Your physician wants you to follow-up in: 12 months with Dr. Cooper. You will receive a reminder letter in the mail two months in advance. If you don't receive a letter, please call our office to schedule the follow-up appointment.  

## 2010-10-13 ENCOUNTER — Encounter: Payer: Self-pay | Admitting: Cardiovascular Disease

## 2010-10-18 ENCOUNTER — Telehealth: Payer: Self-pay | Admitting: Cardiovascular Disease

## 2010-10-18 DIAGNOSIS — I10 Essential (primary) hypertension: Secondary | ICD-10-CM

## 2010-10-18 MED ORDER — VALSARTAN-HYDROCHLOROTHIAZIDE 80-12.5 MG PO TABS: 1.0000 | ORAL_TABLET | Freq: Every day | ORAL | Status: DC

## 2010-10-18 NOTE — Telephone Encounter (Signed)
Pt needs refill [facility], diavan hct 80/12.5 mg #30 to last until [facility] can release med

## 2010-10-26 ENCOUNTER — Encounter: Payer: Self-pay | Admitting: Cardiovascular Disease

## 2011-09-19 ENCOUNTER — Encounter: Payer: Self-pay | Admitting: Cardiovascular Disease

## 2011-09-19 ENCOUNTER — Ambulatory Visit (INDEPENDENT_AMBULATORY_CARE_PROVIDER_SITE_OTHER): Payer: BC Managed Care – PPO | Admitting: Cardiovascular Disease

## 2011-09-19 VITALS — BP 134/86 | HR 54 | Ht 70.0 in | Wt 215.1 lb

## 2011-09-19 DIAGNOSIS — I2510 Atherosclerotic heart disease of native coronary artery without angina pectoris: Secondary | ICD-10-CM

## 2011-09-19 DIAGNOSIS — I6529 Occlusion and stenosis of unspecified carotid artery: Secondary | ICD-10-CM

## 2011-09-19 LAB — CBC WITH DIFFERENTIAL/PLATELET
HCT: 43.8 % (ref 39.0–52.0)
Hemoglobin: 14.5 g/dL (ref 13.0–17.0)
Lymphocytes Relative: 35 % (ref 12.0–46.0)
Lymphs Abs: 1.9 10*3/uL (ref 0.7–4.0)
MCHC: 33.1 g/dL (ref 30.0–36.0)
MCV: 89.4 fl (ref 78.0–100.0)
Neutro Abs: 2.9 10*3/uL (ref 1.4–7.7)
Neutrophils Relative %: 53.5 % (ref 43.0–77.0)
Platelets: 272 10*3/uL (ref 150.0–400.0)
RDW: 13.7 % (ref 11.5–14.6)

## 2011-09-19 LAB — BASIC METABOLIC PANEL
BUN: 10 mg/dL (ref 6–23)
Chloride: 101 mEq/L (ref 96–112)
Creatinine, Ser: 0.7 mg/dL (ref 0.4–1.5)
Potassium: 3.9 mEq/L (ref 3.5–5.1)

## 2011-09-19 LAB — HEPATIC FUNCTION PANEL
Bilirubin, Direct: 0.1 mg/dL (ref 0.0–0.3)
Total Bilirubin: 0.5 mg/dL (ref 0.3–1.2)

## 2011-09-19 LAB — LIPID PANEL: Triglycerides: 235 mg/dL — ABNORMAL HIGH (ref 0.0–149.0)

## 2011-09-19 LAB — TESTOSTERONE: Testosterone: 230.54 ng/dL — ABNORMAL LOW (ref 350.00–890.00)

## 2011-09-19 MED ORDER — METOPROLOL SUCCINATE ER 50 MG PO TB24: 50.0000 mg | ORAL_TABLET | Freq: Every day | ORAL | Status: DC

## 2011-09-19 NOTE — Progress Notes (Signed)
HPI:  66 year old gentleman with coronary artery disease presenting for followup evaluation. The patient presented with class III anginal symptoms and 2007 and had an abnormal stress test showing a large area of inferolateral ischemia. He underwent PCI of a large obtuse marginal branch of the circumflex utilizing a drug-eluting stent. He has done well since that time with no recurrent anginal symptoms. He reports rare fleeting chest pains, unchanged over several years. He has no exertional symptoms. He specifically denies chest pressure, dyspnea, edema, orthopnea, palpitations, or PND. The patient is limited by low back and bilateral knee pain. He was walking regularly until about 3 months ago when he became more limited with his back problems. He has no other complaints today.  Outpatient Encounter Prescriptions as of 09/19/2011  Medication Sig Dispense Refill  . aspirin 81 MG tablet Take 162 mg by mouth daily.        Marland Kitchen b complex vitamins tablet Take 1 tablet by mouth daily.      . cholecalciferol (VITAMIN D) 1000 UNITS tablet Take 1,000 Units by mouth as needed.      . fish oil-omega-3 fatty acids 1000 MG capsule Take 2 g by mouth as needed.       Marland Kitchen ibuprofen (ADVIL,MOTRIN) 200 MG tablet Take 200 mg by mouth every 6 (six) hours as needed.        . Magnesium 200 MG TABS Take 200 mg by mouth as needed.       . metoprolol (TOPROL-XL) 50 MG 24 hr tablet Take 1 tablet (50 mg total) by mouth daily.  90 tablet  3  . zinc gluconate 50 MG tablet Take 50 mg by mouth daily.        Marland Kitchen DISCONTD: Ascorbic Acid (VITAMIN C) 500 MG tablet Take 500 mg by mouth daily.        Marland Kitchen DISCONTD: Coenzyme Q10 Liposomal 100 MG/ML LIQD Take by mouth. 2 tsp       . DISCONTD: valsartan-hydrochlorothiazide (DIOVAN-HCT) 80-12.5 MG per tablet Take 1 tablet by mouth daily.  30 tablet  1    No Known Allergies  Past Medical History  Diagnosis Date  . CAD (coronary artery disease)   . HTN (hypertension)   . HLD (hyperlipidemia)      ROS: Positive for erectile dysfunction, otherwise negative except as per HPI  BP 134/86  Pulse 54  Ht 5\' 10"  (1.778 m)  Wt 97.578 kg (215 lb 1.9 oz)  BMI 30.87 kg/m2  PHYSICAL EXAM: Pt is alert and oriented, NAD HEENT: normal Neck: JVP - normal, carotids 2+= without bruits Lungs: CTA bilaterally CV: RRR without murmur or gallop Abd: soft, NT, Positive BS, no hepatomegaly Ext: no C/C/E, distal pulses intact and equal Skin: warm/dry no rash  EKG:  Sinus bradycardia 54 beats per minute, otherwise within normal limits.  ASSESSMENT AND PLAN: 1. Native vessel coronary artery disease. The patient is stable without anginal symptoms. He is tolerating low-dose aspirin as well as a beta blocker. He takes fish oil for cholesterol lowering. His EKG remains normal and with an absence of symptoms we will plan on seeing him back in one year.  2. Carotid artery stenosis without history of infarction. The patient has mild carotid disease with 40-59% stenosis of the right internal carotid artery and less than 40% stenosis on the left. Will check a carotid duplex scan when he returns in one year for his followup visit.  3. Hyperlipidemia. The patient has tried 4 different statin medications and  was unable to tolerate any of them because of myalgias. He is due for blood work and we will check a lipid panel today. He requested withdrawal of his blood work and forwarded to his primary care physician so we will plan on doing this. Will check a CBC, metabolic panel, lipid panel, testosterone level, TSH, and PSA.  4. Hypertension. Controlled on metoprolol.  Tonny Bollman 09/19/2011 8:54 AM

## 2011-09-19 NOTE — Patient Instructions (Addendum)
Your physician wants you to follow-up in: 12 months.  You will receive a reminder letter in the mail two months in advance. If you don't receive a letter, please call our office to schedule the follow-up appointment.  Your physician has requested that you have a carotid duplex. This test is an ultrasound of the carotid arteries in your neck. It looks at blood flow through these arteries that supply the brain with blood. Allow one hour for this exam. There are no restrictions or special instructions. To be done in 12 months on day of appt with Dr. Excell Seltzer

## 2011-09-20 ENCOUNTER — Telehealth: Payer: Self-pay | Admitting: Cardiovascular Disease

## 2011-09-20 ENCOUNTER — Other Ambulatory Visit: Payer: Self-pay | Admitting: *Deleted

## 2011-09-20 MED ORDER — ROSUVASTATIN CALCIUM 5 MG PO TABS: 5.0000 mg | ORAL_TABLET | ORAL | Status: DC

## 2011-09-20 NOTE — Telephone Encounter (Signed)
Spoke with pt, aware of labs. See result note

## 2011-09-20 NOTE — Telephone Encounter (Signed)
Pt rtn call re lab work, pls call [phone]

## 2012-02-15 ENCOUNTER — Other Ambulatory Visit: Payer: Self-pay | Admitting: Cardiology

## 2012-02-15 MED ORDER — ROSUVASTATIN CALCIUM 5 MG PO TABS: 5.0000 mg | ORAL_TABLET | ORAL | Status: DC

## 2012-02-15 NOTE — Telephone Encounter (Signed)
Fax Received. Refill Completed. Heena Woodbury Chowoe (R.M.A)   

## 2012-05-08 ENCOUNTER — Other Ambulatory Visit (HOSPITAL_COMMUNITY): Payer: Self-pay | Admitting: Orthopedic Surgery

## 2012-05-08 ENCOUNTER — Ambulatory Visit (HOSPITAL_COMMUNITY)
Admission: RE | Admit: 2012-05-08 | Discharge: 2012-05-08 | Disposition: A | Discharge status: Home / Self Care | Payer: BC Managed Care – PPO | Source: Ambulatory Visit | Attending: Orthopedic Surgery | Admitting: Orthopedic Surgery

## 2012-05-08 DIAGNOSIS — Z1389 Encounter for screening for other disorder: Secondary | ICD-10-CM | POA: Insufficient documentation

## 2012-05-08 DIAGNOSIS — M545 Low back pain, unspecified: Secondary | ICD-10-CM | POA: Insufficient documentation

## 2012-06-02 ENCOUNTER — Telehealth: Payer: Self-pay | Admitting: Cardiovascular Disease

## 2012-06-02 NOTE — Telephone Encounter (Signed)
Walk in pt Form " [facility]" gave to Lauren 06/02/12/KM

## 2012-06-05 ENCOUNTER — Telehealth: Payer: Self-pay | Admitting: Cardiovascular Disease

## 2012-06-05 NOTE — Telephone Encounter (Signed)
New problem   Pt dropped off some forms 06/02/12 for Dr Excell Seltzer to fill out for him to have back surgery, he stated he hasn't heard anything back and was wondering if he had filled out the forms.

## 2012-06-05 NOTE — Telephone Encounter (Signed)
Form is on Dr. Earmon Phoenix cart to be completed when he is back in office.  I spoke with pt and gave him this information and let him know Dr. Excell Seltzer would be in office tomorrow.

## 2012-06-06 NOTE — Telephone Encounter (Signed)
Per Dr Excell Seltzer this pt needs to be evaluated in the office for surgical clearance.  Pt scheduled to see Tereso Newcomer PA-C on 06/09/12.

## 2012-06-09 ENCOUNTER — Ambulatory Visit: Payer: BC Managed Care – PPO | Admitting: Physician Assistant

## 2012-06-11 ENCOUNTER — Ambulatory Visit (INDEPENDENT_AMBULATORY_CARE_PROVIDER_SITE_OTHER): Payer: BC Managed Care – PPO | Admitting: Physician Assistant

## 2012-06-11 ENCOUNTER — Encounter: Payer: Self-pay | Admitting: Physician Assistant

## 2012-06-11 VITALS — BP 152/90 | HR 66 | Ht 71.0 in | Wt 224.8 lb

## 2012-06-11 DIAGNOSIS — E785 Hyperlipidemia, unspecified: Secondary | ICD-10-CM

## 2012-06-11 DIAGNOSIS — Z01818 Encounter for other preprocedural examination: Secondary | ICD-10-CM

## 2012-06-11 DIAGNOSIS — I6529 Occlusion and stenosis of unspecified carotid artery: Secondary | ICD-10-CM

## 2012-06-11 DIAGNOSIS — I2510 Atherosclerotic heart disease of native coronary artery without angina pectoris: Secondary | ICD-10-CM

## 2012-06-11 DIAGNOSIS — I10 Essential (primary) hypertension: Secondary | ICD-10-CM

## 2012-06-11 NOTE — Patient Instructions (Addendum)
Your physician recommends that you schedule a follow-up appointment in: June  2014  WITH DR COOPER CAROTIDS SAME DAY Your physician recommends that you continue on your current medications as directed. Please refer to the Current Medication list given to you today.

## 2012-06-11 NOTE — Assessment & Plan Note (Signed)
Patient is here for preoperative cardiac clearance before undergoing back surgery for spinal stenosis. The patient has history of drug alluding stent to a large OM circumflex in 2007. He had non-obstructive disease in the other vessels and normal LV function. He has had no anginal symptoms since then. He continues to work daily and was walking regularly until 2 months ago when he became limited due to his back problems. I discussed this patient in detail with Dr. Berton Mount who agrees that the patient can proceed with back surgery without further cardiac workup.

## 2012-06-11 NOTE — Assessment & Plan Note (Signed)
Status post drug-eluting stent to the circumflex OM in 2007. Stable without symptoms.

## 2012-06-11 NOTE — Progress Notes (Signed)
HPI:   This is a 67 year old male patient followed by Dr. Excell Seltzer who has history of coronary artery disease. He initially presented class III anginal symptoms and had a Myoview scan demonstrating a large area of inferolateral ischemia. He underwent stenting of severe stenosis in the OM branch of the circumflex in 2007 with a drug-eluting stent. He had only mild nonobstructive disease in the other coronary arteries and normal LV function.He also has history of carotid artery stenosis without infarction he has mild  40-59% stenosis of the right internal carotid artery and less than 40% stenosis on the left. Followup carotids are scheduled in June 2014. He has been on unable to tolerate statins because of myalgias. He has been on Crestor since June and had blood work by Dr. Lorin Picket in January but we don't have these results.  The patient comes in today for preoperative surgical clearance before undergoing back surgery by Dr. Netta Corrigan. He denies any chest pain, palpitations, dyspnea, dyspnea on exertion, dizziness, or presyncope. His blood pressure is elevated today but he says he has white coat syndrome. He checks his blood pressure every 3-4 days a home and it usually runs about 138/70. He was walking 2 miles a day until 2 months ago when his back became more severe. 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   No Known Allergies  Current Outpatient Prescriptions on File Prior to Visit: aspirin 81 MG tablet, Take 162 mg by mouth daily.  , Disp: , Rfl:  b complex vitamins tablet, Take 1 tablet by mouth daily., Disp: , Rfl:  cholecalciferol (VITAMIN D) 1000 UNITS tablet, Take 1,000 Units by mouth as needed., Disp: , Rfl:  fish oil-omega-3 fatty acids 1000 MG capsule, Take 2 g by mouth as needed. , Disp: , Rfl:  ibuprofen (ADVIL,MOTRIN) 200 MG tablet, Take 200 mg by mouth every 6 (six) hours as needed.  ,  Disp: , Rfl:  Magnesium 200 MG TABS, Take 200 mg by mouth as needed. , Disp: , Rfl:  metoprolol succinate (TOPROL-XL) 50 MG 24 hr tablet, Take 1 tablet (50 mg total) by mouth daily., Disp: 90 tablet, Rfl: 3 rosuvastatin (CRESTOR) 5 MG tablet, Take 1 tablet (5 mg total) by mouth every other day., Disp: 90 tablet, Rfl: 3 zinc gluconate 50 MG tablet, Take 50 mg by mouth daily.  , Disp: , Rfl:   No current facility-administered medications on file prior to visit.   Past Medical History:   CAD (coronary artery disease)                                HTN (hypertension)                                           HLD (hyperlipidemia)                                        No past surgical history on file.  No family history on file.   Social History   Marital Status: Married             Spouse Name:  Years of Education:                 Number of children: 1           Occupational History Occupation          Armed forces logistics/support/administrative officer*                       Social History Main Topics   Smoking Status: Former Smoker                   Packs/Day: 0.00  Years:           Quit date: 04/10/1983   Smokeless Status: Not on file                      Alcohol Use: Yes           7.0 oz/week      14 Drinks containing 0.5 oz of alcohol per week   Drug Use: Not on file    Sexual Activity: Not on file        Other Topics            Concern   None on file  Social History Narrative   None on file    ROS:  See HPI Eyes: Negative Ears:Negative for hearing loss, tinnitus Cardiovascular: Negative for chest pain, palpitations,irregular heartbeat, dyspnea, dyspnea on exertion, near-syncope, orthopnea, paroxysmal nocturnal dyspnea and syncope,edema, claudication, cyanosis,.  Respiratory:   Negative for cough, hemoptysis, shortness of breath, sleep disturbances due to breathing, sputum production and wheezing.   Endocrine: Negative for cold  intolerance and heat intolerance.  Hematologic/Lymphatic: Negative for adenopathy and bleeding problem. Does not bruise/bleed easily.  Musculoskeletal: significant back pain from spinal stenosis, also arthritis in his knees   Gastrointestinal: Negative for nausea, vomiting, reflux, abdominal pain, diarrhea, constipation.   Neurological: Negative.  Allergic/Immunologic: Negative for environmental allergies.    PHYSICAL EXAM: Well-nournished, in no acute distress. Neck: No JVD, HJR, Bruit, or thyroid enlargement  Lungs: No tachypnea, clear without wheezing, rales, or rhonchi  Cardiovascular: RRR, PMI not displaced, positive S4 no murmur, bruit, thrill, or heave.  Abdomen: BS normal. Soft without organomegaly, masses, lesions or tenderness.  Extremities: without cyanosis, clubbing or edema. Good distal pulses bilateral  SKin: Warm, no lesions or rashes   Musculoskeletal: No deformities  Neuro: no focal signs  There were no vitals taken for this visit.   EKG:EKG from Dr. Roby Lofts office in January sinus bradycardia at 56 beats per minute without acute change  Cath 2007: ASSESSMENT:  1. Severe single-vessel coronary artery disease.  2. Nonobstructive disease in the left anterior descending and right      coronary artery.  3. Normal left ventricular function.    PLAN:  As described above, PCI of the second obtuse marginal branch was  performed with a 3 x 20 Taxus stent with an excellent angiographic  result.  The patient should continue on aspirin and clopidogrel for a  minimum of 12 months.  He has no other significant disease but will  require aggressive medical therapy to prevent progression of his other  nonobstructive disease.          Veverly Fells. Excell Seltzer, MD  Electronically Signed        MDC/MEDQ  D:  03/11/2006  T:  03/12/2006  Job:  [id number]    cc:   Maryella Shivers. Lorin Picket, M.D.

## 2012-06-11 NOTE — Assessment & Plan Note (Signed)
Patient is unable to take statins. He is on Crestor. Lipid profile was done in January by Dr. Lorin Picket. I asked the patient to get [place] a copy of these labs

## 2012-06-11 NOTE — Assessment & Plan Note (Signed)
He has mild carotid disease with a 40-59% stenosis in the right internal carotid artery and less than 40% stenosis in the left.Patient is scheduled for followup carotid Dopplers in June 2014

## 2012-06-11 NOTE — Assessment & Plan Note (Signed)
Blood pressure is elevated today but the patient does have white coat syndrome. Blood pressure usually is stable at home. No adjustments made today. Asked him to limit his sodium intake.

## 2012-06-23 ENCOUNTER — Encounter: Payer: Self-pay | Admitting: Cardiovascular Disease

## 2012-09-16 ENCOUNTER — Ambulatory Visit (INDEPENDENT_AMBULATORY_CARE_PROVIDER_SITE_OTHER): Payer: BC Managed Care – PPO | Admitting: Cardiovascular Disease

## 2012-09-16 ENCOUNTER — Encounter (INDEPENDENT_AMBULATORY_CARE_PROVIDER_SITE_OTHER): Payer: BC Managed Care – PPO

## 2012-09-16 ENCOUNTER — Encounter: Payer: Self-pay | Admitting: Cardiovascular Disease

## 2012-09-16 VITALS — BP 132/88 | HR 57 | Ht 71.0 in | Wt 218.0 lb

## 2012-09-16 DIAGNOSIS — E7800 Pure hypercholesterolemia, unspecified: Secondary | ICD-10-CM

## 2012-09-16 DIAGNOSIS — I6529 Occlusion and stenosis of unspecified carotid artery: Secondary | ICD-10-CM

## 2012-09-16 DIAGNOSIS — I2510 Atherosclerotic heart disease of native coronary artery without angina pectoris: Secondary | ICD-10-CM

## 2012-09-16 LAB — BASIC METABOLIC PANEL
BUN: 9 mg/dL (ref 6–23)
CO2: 23 mEq/L (ref 19–32)
Chloride: 105 mEq/L (ref 96–112)
Glucose, Bld: 86 mg/dL (ref 70–99)
Potassium: 3.8 mEq/L (ref 3.5–5.1)
Sodium: 138 mEq/L (ref 135–145)

## 2012-09-16 LAB — LIPID PANEL
HDL: 38.7 mg/dL — ABNORMAL LOW (ref >39.00)
Total CHOL/HDL Ratio: 7
Triglycerides: 315 mg/dL — ABNORMAL HIGH (ref 0.0–149.0)

## 2012-09-16 LAB — HEPATIC FUNCTION PANEL
Bilirubin, Direct: 0 mg/dL (ref 0.0–0.3)
Total Bilirubin: 0.7 mg/dL (ref 0.3–1.2)

## 2012-09-16 MED ORDER — METOPROLOL SUCCINATE ER 50 MG PO TB24: 50.0000 mg | ORAL_TABLET | Freq: Every day | ORAL | Status: DC

## 2012-09-16 NOTE — Progress Notes (Signed)
HPI:  67 year old gentleman presenting for followup evaluation. He's followed for coronary artery disease. He had exertional angina back in 2007 and had a high risk stress test result demonstrating a large area of inferolateral ischemia. He underwent PCI of a large obtuse marginal branch of the left circumflex. He's done well since that time with no recurrent anginal symptoms. He's also followed for carotid stenosis. He had a carotid duplex this morning that showed less than 40% ICA stenosis bilaterally. He had only mild plaque noted. The patient is statin intolerant. He is now on Crestor 5 mg daily. Last year his labs were drawn when he was not taking any lipid-lowering therapy. At that time his cholesterol is 285, triglycerides 235, HDL 49, and LDL 196.  The patient is doing well. He went back and forth with back surgery, but decided not to pursue this. He's been doing physical therapy and his back is feeling better. He had gained a good bit of weight, but more recently has lost 6 pounds. He says "I'm working on it." He denies chest pain or pressure, dyspnea, edema, or palpitations. He's been compliant with his medications.  Outpatient Encounter Prescriptions as of 09/16/2012  Medication Sig Dispense Refill  . aspirin 81 MG tablet Take 162 mg by mouth daily.        Marland Kitchen b complex vitamins tablet Take 1 tablet by mouth daily.      . cholecalciferol (VITAMIN D) 1000 UNITS tablet Take 1,000 Units by mouth as needed.      . fish oil-omega-3 fatty acids 1000 MG capsule Take 2 g by mouth as needed.       Marland Kitchen ibuprofen (ADVIL,MOTRIN) 200 MG tablet Take 200 mg by mouth every 6 (six) hours as needed.        . Magnesium 200 MG TABS Take 200 mg by mouth as needed.       . metoprolol succinate (TOPROL-XL) 50 MG 24 hr tablet Take 1 tablet (50 mg total) by mouth daily.  90 tablet  3  . rosuvastatin (CRESTOR) 5 MG tablet Take 1 tablet (5 mg total) by mouth every other day.  90 tablet  3  . zinc gluconate 50 MG tablet  Take 50 mg by mouth daily.         No facility-administered encounter medications on file as of 09/16/2012.    No Known Allergies  Past Medical History  Diagnosis Date  . CAD (coronary artery disease)   . HTN (hypertension)   . HLD (hyperlipidemia)    ROS: Negative except as per HPI  BP 132/88  Pulse 57  Ht 5\' 11"  (1.803 m)  Wt 98.884 kg (218 lb)  BMI 30.42 kg/m2  SpO2 97%  PHYSICAL EXAM: Pt is alert and oriented, overweight male in NAD HEENT: normal Neck: JVP - normal, carotids 2+= without bruits Lungs: CTA bilaterally CV: RRR without murmur or gallop Abd: soft, NT, Positive BS, no hepatomegaly Ext: no C/C/E, distal pulses intact and equal Skin: warm/dry no rash  EKG:  Sinus bradycardia with sinus arrhythmia, heart rate 55 beats per minute, otherwise within normal limits.  Carotid duplex exam 09/16/2012: Preliminary report is less than 40% bilateral ICA stenosis with mild mixed plaque bilaterally. The vertebral arteries are patent with antegrade flow.  ASSESSMENT AND PLAN: 1. Coronary artery disease, native vessel. The patient is stable without anginal symptoms. His medical program is appropriate. We discussed the importance of weight loss as it pertains to his cardiovascular health as  well as his low back problems.  2. Hyperlipidemia. He has a history of statin intolerance, but his able to take Crestor 5 mg. Will check labs today.  3. Hypertension. He notes whitecoat hypertension. Blood pressure at home is been in the 130s over 70s. Continue metoprolol succinate.  4. Carotid stenosis with no history of stroke or TIA. Today's duplex scan was reviewed. Will repeat in a few years.  Tonny Bollman 09/16/2012 11:28 AM

## 2012-09-16 NOTE — Patient Instructions (Signed)
Your physician recommends that you have a FASTING Lipid, Liver and BMP today.  Your physician wants you to follow-up in: 1 YEAR with Dr Excell Seltzer.  You will receive a reminder letter in the mail two months in advance. If you don't receive a letter, please call our office to schedule the follow-up appointment.  Your physician has requested that you have a carotid duplex in 2 YEARS. This test is an ultrasound of the carotid arteries in your neck. It looks at blood flow through these arteries that supply the brain with blood. Allow one hour for this exam. There are no restrictions or special instructions.  Your physician recommends that you continue on your current medications as directed. Please refer to the Current Medication list given to you today.

## 2012-09-17 ENCOUNTER — Ambulatory Visit: Payer: BC Managed Care – PPO | Admitting: Cardiovascular Disease

## 2012-09-19 ENCOUNTER — Telehealth: Payer: Self-pay | Admitting: Cardiovascular Disease

## 2012-09-19 DIAGNOSIS — E7800 Pure hypercholesterolemia, unspecified: Secondary | ICD-10-CM

## 2012-09-19 MED ORDER — EZETIMIBE 10 MG PO TABS: 10.0000 mg | ORAL_TABLET | Freq: Every day | ORAL | Status: DC

## 2012-09-19 NOTE — Telephone Encounter (Signed)
Message copied by Burnell Blanks on Fri Sep 19, 2012 12:10 PM ------      Message from: Tonny Bollman      Created: Tue Sep 16, 2012 10:19 PM       Lipids are way above goal. If taking crestor 5 mg, would add zetia 10 mg daily. He is statin intolerant - don't think he'll tolerate higher crestor dose. ------

## 2012-09-19 NOTE — Telephone Encounter (Signed)
New Problem:    Patient called in returning Lauren's call regarding his latest labs.  Please call back.

## 2012-09-19 NOTE — Telephone Encounter (Signed)
Advised patient of lab results and left samples at front desk

## 2012-09-30 NOTE — Telephone Encounter (Signed)
The pt will need a lipid and liver repeated in 3 months.  Labs scheduled on 12/29/12.  Message left on pt's cell phone to advise him of need for repeat lab work and appointment card mailed to the pt's home.

## 2012-11-21 ENCOUNTER — Other Ambulatory Visit: Payer: Self-pay | Admitting: Cardiovascular Disease

## 2012-12-29 ENCOUNTER — Other Ambulatory Visit (INDEPENDENT_AMBULATORY_CARE_PROVIDER_SITE_OTHER): Payer: BC Managed Care – PPO

## 2012-12-29 DIAGNOSIS — E7800 Pure hypercholesterolemia, unspecified: Secondary | ICD-10-CM

## 2012-12-29 LAB — LDL CHOLESTEROL, DIRECT: Direct LDL: 198.1 mg/dL

## 2012-12-29 LAB — HEPATIC FUNCTION PANEL
ALT: 19 U/L (ref 0–53)
AST: 21 U/L (ref 0–37)
Albumin: 3.9 g/dL (ref 3.5–5.2)
Alkaline Phosphatase: 42 U/L (ref 39–117)
Total Protein: 7.5 g/dL (ref 6.0–8.3)

## 2012-12-29 LAB — LIPID PANEL
Cholesterol: 282 mg/dL — ABNORMAL HIGH (ref 0–200)
Total CHOL/HDL Ratio: 7
Triglycerides: 275 mg/dL — ABNORMAL HIGH (ref 0.0–149.0)

## 2013-03-29 ENCOUNTER — Other Ambulatory Visit: Payer: Self-pay | Admitting: Cardiovascular Disease

## 2013-05-05 ENCOUNTER — Encounter: Payer: Self-pay | Admitting: Nurse Practitioner

## 2013-05-05 ENCOUNTER — Telehealth: Payer: Self-pay | Admitting: Cardiovascular Disease

## 2013-05-05 NOTE — Telephone Encounter (Signed)
New message  Patient needs a form filled out that he's been seen in the last year. 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 Please call and advise.

## 2013-05-05 NOTE — Telephone Encounter (Signed)
Spoke with patient who states he needs a letter faxed to [facility] for DOT stating date of last office visit and that patient has no restrictions for driving.  I advised patient that I will clear with Dr. Burt Knack and fax letter.  Fax number is [phone].  Patient verbalized agreement and understanding

## 2013-05-07 NOTE — Telephone Encounter (Signed)
Letter was faxed on Tues. 1/27 and confirmation received

## 2013-06-01 DIAGNOSIS — Z86010 Personal history of colonic polyps: Secondary | ICD-10-CM | POA: Diagnosis not present

## 2013-07-03 DIAGNOSIS — M2390 Unspecified internal derangement of unspecified knee: Secondary | ICD-10-CM | POA: Diagnosis not present

## 2013-07-20 DIAGNOSIS — M545 Low back pain, unspecified: Secondary | ICD-10-CM | POA: Diagnosis not present

## 2013-09-17 ENCOUNTER — Telehealth: Payer: Self-pay | Admitting: Cardiovascular Disease

## 2013-09-17 NOTE — Telephone Encounter (Signed)
Pt has not seen Dr Burt Knack since 09/16/2012.  I will forward this message to Dr Burt Knack to review.

## 2013-09-17 NOTE — Telephone Encounter (Signed)
New Message:  Pt states he has a torn meniscus in his knee.. States he is scheduled for surgery on 09/23/13. Pt wants to know if he can stop his aspirin 5 days prior to surgery... Requests a call back from nurse

## 2013-09-18 NOTE — Telephone Encounter (Signed)
I spoke with the pt and made him aware that we have not been asked to clear him from a cardiac standpoint. I did tell him he could hold ASA 5 days prior to surgery.

## 2013-09-18 NOTE — Telephone Encounter (Signed)
Discussed pt with Dr Burt Knack and the pt can hold Aspirin prior to surgery but we are not authorizing cardiac clearance for this patient.  He has not been seen since 09/16/12.

## 2013-09-24 ENCOUNTER — Ambulatory Visit: Payer: BC Managed Care – PPO | Admitting: Cardiovascular Disease

## 2013-10-21 DIAGNOSIS — M23359 Other meniscus derangements, posterior horn of lateral meniscus, unspecified knee: Secondary | ICD-10-CM | POA: Diagnosis not present

## 2013-10-21 DIAGNOSIS — M23329 Other meniscus derangements, posterior horn of medial meniscus, unspecified knee: Secondary | ICD-10-CM | POA: Diagnosis not present

## 2013-10-21 DIAGNOSIS — M25569 Pain in unspecified knee: Secondary | ICD-10-CM | POA: Diagnosis not present

## 2013-10-26 DIAGNOSIS — M25569 Pain in unspecified knee: Secondary | ICD-10-CM | POA: Diagnosis not present

## 2013-10-26 DIAGNOSIS — M23329 Other meniscus derangements, posterior horn of medial meniscus, unspecified knee: Secondary | ICD-10-CM | POA: Diagnosis not present

## 2013-10-26 DIAGNOSIS — M23359 Other meniscus derangements, posterior horn of lateral meniscus, unspecified knee: Secondary | ICD-10-CM | POA: Diagnosis not present

## 2013-10-28 DIAGNOSIS — M23359 Other meniscus derangements, posterior horn of lateral meniscus, unspecified knee: Secondary | ICD-10-CM | POA: Diagnosis not present

## 2013-10-28 DIAGNOSIS — M25569 Pain in unspecified knee: Secondary | ICD-10-CM | POA: Diagnosis not present

## 2013-10-28 DIAGNOSIS — M23329 Other meniscus derangements, posterior horn of medial meniscus, unspecified knee: Secondary | ICD-10-CM | POA: Diagnosis not present

## 2013-11-05 ENCOUNTER — Ambulatory Visit (INDEPENDENT_AMBULATORY_CARE_PROVIDER_SITE_OTHER): Payer: Medicare Other | Admitting: Cardiovascular Disease

## 2013-11-05 ENCOUNTER — Encounter: Payer: Self-pay | Admitting: Cardiovascular Disease

## 2013-11-05 VITALS — BP 160/100 | HR 54 | Ht 71.0 in | Wt 217.0 lb

## 2013-11-05 DIAGNOSIS — E785 Hyperlipidemia, unspecified: Secondary | ICD-10-CM | POA: Diagnosis not present

## 2013-11-05 DIAGNOSIS — I6529 Occlusion and stenosis of unspecified carotid artery: Secondary | ICD-10-CM

## 2013-11-05 DIAGNOSIS — I2510 Atherosclerotic heart disease of native coronary artery without angina pectoris: Secondary | ICD-10-CM

## 2013-11-05 LAB — HEPATIC FUNCTION PANEL
ALBUMIN: 4 g/dL (ref 3.5–5.2)
ALT: 18 U/L (ref 0–53)
AST: 22 U/L (ref 0–37)
Alkaline Phosphatase: 51 U/L (ref 39–117)
Bilirubin, Direct: 0 mg/dL (ref 0.0–0.3)
TOTAL PROTEIN: 7.6 g/dL (ref 6.0–8.3)
Total Bilirubin: 0.7 mg/dL (ref 0.2–1.2)

## 2013-11-05 LAB — LIPID PANEL
CHOL/HDL RATIO: 7
CHOLESTEROL: 315 mg/dL — AB (ref 0–200)
HDL: 44.9 mg/dL (ref >39.00)
NonHDL: 270.1
TRIGLYCERIDES: 279 mg/dL — AB (ref 0.0–149.0)
VLDL: 55.8 mg/dL — AB (ref 0.0–40.0)

## 2013-11-05 LAB — LDL CHOLESTEROL, DIRECT: LDL DIRECT: 218.4 mg/dL

## 2013-11-05 MED ORDER — METOPROLOL SUCCINATE ER 50 MG PO TB24: 50.0000 mg | ORAL_TABLET | Freq: Every day | ORAL | Status: DC

## 2013-11-05 NOTE — Patient Instructions (Addendum)
Your physician recommends that you continue on your current medications as directed. Please refer to the Current Medication list given to you today. Your physician recommends that you return for lab work in: today (lipids, liver function) Your physician wants you to follow-up in: 1 year with Dr. Burt Knack.  You will receive a reminder letter in the mail two months in advance. If you don't receive a letter, please call our office to schedule the follow-up appointment.  You have been referred to the [facility] to see Alferd Apa, PharmD. We will schedule an appointment for you to meet with Ysidro Evert.

## 2013-11-05 NOTE — Progress Notes (Signed)
HPI:  68 year old gentleman presenting for followup evaluation. He's followed for coronary artery disease. He had exertional angina back in 2007 and had a high risk stress test result demonstrating a large area of inferolateral ischemia. He underwent PCI of a large obtuse marginal branch of the left circumflex. He's done well since that time with no recurrent anginal symptoms.   The patient has been statin intolerant. His last cholesterol values from September 2014 showed a cholesterol of 282, triglycerides 275, HDL 40, and LDL of 198.  He has white coat HTN. Home BP's have been running 130's/70's. He checks his BP about twice per week. He denies exertional chest pain or pressure. No dyspnea or other cardiac-related complaints. He's primarily been limited by a left knee injury and he had surgery in June to treat a right meniscal tear.  He's been intolerant to statin drugs. Most recently he failed a trial of Crestor 5 mg 3 days weekly. He describes low back and leg pain with this. He does admit to problems with spinal stenosis, but clearly states his symptoms were much worse when he was on Crestor.   Outpatient Encounter Prescriptions as of 11/05/2013  Medication Sig  . aspirin 81 MG tablet Take 162 mg by mouth daily.    Marland Kitchen ezetimibe (ZETIA) 10 MG tablet Take 1 tablet (10 mg total) by mouth daily.  Marland Kitchen ibuprofen (ADVIL,MOTRIN) 200 MG tablet Take 200 mg by mouth every 6 (six) hours as needed.    . metoprolol succinate (TOPROL-XL) 50 MG 24 hr tablet Take 1 tablet (50 mg total) daily  . zinc gluconate 50 MG tablet Take 50 mg by mouth daily.    . [DISCONTINUED] b complex vitamins tablet Take 1 tablet by mouth daily.  . [DISCONTINUED] cholecalciferol (VITAMIN D) 1000 UNITS tablet Take 1,000 Units by mouth as needed.  . [DISCONTINUED] CRESTOR 5 MG tablet Take 1 tablet by mouth  every other day  . [DISCONTINUED] fish oil-omega-3 fatty acids 1000 MG capsule Take 2 g by mouth as needed.   .  [DISCONTINUED] Magnesium 200 MG TABS Take 200 mg by mouth as needed.     No Known Allergies  Past Medical History  Diagnosis Date  . CAD (coronary artery disease)   . HTN (hypertension)   . HLD (hyperlipidemia)     ROS: Negative except as per HPI  BP 160/100  Pulse 54  Ht 5\' 11"  (1.803 m)  Wt 217 lb (98.431 kg)  BMI 30.28 kg/m2  PHYSICAL EXAM: Pt is alert and oriented, pleasant overweight male in NAD HEENT: normal Neck: JVP - normal, carotids 2+= without bruits Lungs: CTA bilaterally CV: RRR without murmur or gallop Abd: soft, NT, Positive BS, no hepatomegaly Ext: no C/C/E, distal pulses intact and equal Skin: warm/dry no rash  EKG:  Sinus bradycardia 54 beats per minute, otherwise within normal limits.  ASSESSMENT AND PLAN: 1. Coronary artery disease, native vessel. The patient remains stable without symptoms of angina. He will continue on aspirin and a beta blocker. He is statin intolerant. EKG is normal and without anginal symptoms I will plan on seeing him back in one year.  2. Hyperlipidemia. The patient is taking zetia. He is due for repeat lipids. Considering his extremely high LDL cholesterol, I think he should be considered for a PCSK9 Inhibitor. I am going to refer him to the Lipid Clinic for formal evaluation. Diet and exercise counseling was done.  3. HTN - white coat. His home readings are  acceptable. Continue current Rx.   4. Carotid stenosis. Last study reviewed and showed mild (less than 40%) stenosis. Repeat in one year.  Sherren Mocha 11/05/2013 10:47 AM

## 2013-11-10 ENCOUNTER — Ambulatory Visit (INDEPENDENT_AMBULATORY_CARE_PROVIDER_SITE_OTHER): Payer: Medicare Other | Admitting: Pharmacist

## 2013-11-10 VITALS — Wt 218.0 lb

## 2013-11-10 DIAGNOSIS — I2510 Atherosclerotic heart disease of native coronary artery without angina pectoris: Secondary | ICD-10-CM

## 2013-11-10 DIAGNOSIS — Z79899 Other long term (current) drug therapy: Secondary | ICD-10-CM

## 2013-11-10 DIAGNOSIS — E785 Hyperlipidemia, unspecified: Secondary | ICD-10-CM | POA: Diagnosis not present

## 2013-11-10 LAB — C-REACTIVE PROTEIN

## 2013-11-10 NOTE — Progress Notes (Signed)
Patient is a pleasant 68 y.o. WM referred to lipid clinic by Dr. Burt Knack given h/o CAD, LDL > 200 mg/dL at baseline, and inability to tolerate statins.  Patient had PCI 2007, and has failed multiple lipid lowering therapies since then.  Patient took Zetia in the past as well, but stopped due to mild joint aches as well.  He does have arthritis and recent total knee replacement, so some of this may have been more joint related and not Zetia related.  Patient is interested in hearing about this "newer cholesterol" medication that is now out.  He has medicare currently, so Praluent coverage would likely be an issue.  He wants to hear about SPIRE-II clinical trial and try to get into this if possible.  He has LDL > 190 and PCI > 5 years ago, so would need to fit one more criteria to meet inclusion for study.  He hasn't had a Lp(a) or CRP before.  He does not smoke.  Kidney function is fine, and HDL is not low.  He does have evidence of carotid stenosis though.  RF:  CAD, carotid stenosis, HTN, LDL > 190 mg/dL, age - LDL goal < 70, non-HDL goal < 100 Meds:  Not on lipid lowering meds currently.  Stopped Zetia 4 weeks ago following knee surgery, as he though it may be worsening knee, but he's not sure. Intolerant:  Vytorin, Livalo 2 mg qd, Pravastatin 40 mg qd(2008), Crestor 10 mg (2008), Crestor 5 mg tiw (03/2013) - all caused muscle aches  Patient tries to stick to low fat diet when possible. His exercise and walking has been limited due to recent knee surgery.  He is doing physical therapy on this twice weekly currently.  Labs:   10/2013:  TC 315, TG 279, HDL 45, LDL 218, non-HDL 270 mg/dL, LFTs normal (not on lipid lowering therapy)  Current Outpatient Prescriptions  Medication Sig Dispense Refill  . aspirin 81 MG tablet Take 162 mg by mouth daily.        Marland Kitchen ibuprofen (ADVIL,MOTRIN) 200 MG tablet Take 200 mg by mouth every 6 (six) hours as needed.        . metoprolol succinate (TOPROL-XL) 50 MG 24 hr  tablet Take 1 tablet (50 mg total) by mouth daily. Take with or immediately following a meal.  90 tablet  3  . zinc gluconate 50 MG tablet Take 50 mg by mouth daily.         No current facility-administered medications for this visit.   Allergies  Allergen Reactions  . Statins     Vytorin, Livalo 2 mg qd, Pravastatin 40 mg qd (2008), Crestor 10 mg qd (2008), Crestor 5 mg tiw (03/2013) - muscle aches   No family history on file.

## 2013-11-10 NOTE — Assessment & Plan Note (Addendum)
Will get a CRP and Lp(a) today to see if we can get him qualified for [facility].  Will also reach out to [employer] to see if patient can qualify under any other circumstances, perhaps due to carotid stenosis + h/o PCI > 5 years ago + LDL > 190.  Will call patient once I have results and a response from [place].  11/13/13 update:  Lp(a) and CRP were both in normal range, so he does not qualify for SPIRE based on these labs.  Will contact the study site to see if his evidence of carotid stenosis along with his other PMH is enough to get him into study.  I've notified patient about this, and will hopefully have an answer about carotid stenosis and qualifying in the next week.  Patient has had leg weakness since his surgery, and therefore not wanting to restart Zetia or retry low dose Crestor until his leg strength has improved.  Will call patient back once I have an answer from research group.

## 2013-11-11 LAB — LIPOPROTEIN A (LPA): Lipoprotein (a): 21 mg/dL (ref 0–30)

## 2013-11-12 ENCOUNTER — Other Ambulatory Visit: Payer: Self-pay | Admitting: Cardiovascular Disease

## 2013-11-15 ENCOUNTER — Other Ambulatory Visit: Payer: Self-pay | Admitting: Cardiovascular Disease

## 2013-11-23 ENCOUNTER — Other Ambulatory Visit: Payer: Self-pay

## 2013-11-23 MED ORDER — METOPROLOL SUCCINATE ER 50 MG PO TB24: ORAL_TABLET | ORAL | Status: DC

## 2014-08-12 ENCOUNTER — Other Ambulatory Visit (HOSPITAL_COMMUNITY): Payer: Self-pay | Admitting: Cardiovascular Disease

## 2014-08-12 DIAGNOSIS — I6521 Occlusion and stenosis of right carotid artery: Secondary | ICD-10-CM

## 2014-08-27 DIAGNOSIS — I10 Essential (primary) hypertension: Secondary | ICD-10-CM | POA: Diagnosis not present

## 2014-08-27 DIAGNOSIS — Z23 Encounter for immunization: Secondary | ICD-10-CM | POA: Diagnosis not present

## 2014-08-27 DIAGNOSIS — E782 Mixed hyperlipidemia: Secondary | ICD-10-CM | POA: Diagnosis not present

## 2014-08-27 DIAGNOSIS — E780 Pure hypercholesterolemia: Secondary | ICD-10-CM | POA: Diagnosis not present

## 2014-08-27 DIAGNOSIS — Z79899 Other long term (current) drug therapy: Secondary | ICD-10-CM | POA: Diagnosis not present

## 2014-08-27 DIAGNOSIS — Z0000 Encounter for general adult medical examination without abnormal findings: Secondary | ICD-10-CM | POA: Diagnosis not present

## 2014-08-27 DIAGNOSIS — Z125 Encounter for screening for malignant neoplasm of prostate: Secondary | ICD-10-CM | POA: Diagnosis not present

## 2014-10-28 ENCOUNTER — Telehealth: Payer: Self-pay | Admitting: *Deleted

## 2014-10-28 NOTE — Telephone Encounter (Signed)
I CALLED PT TO GET FAMILY HX AND STATUS. PT STATED HE DIDN'T HAVE TIME AND HE WOULD DO IT ON THE DAY OF HIS APPT.

## 2014-11-04 ENCOUNTER — Ambulatory Visit (INDEPENDENT_AMBULATORY_CARE_PROVIDER_SITE_OTHER): Payer: Medicare Other | Admitting: Cardiovascular Disease

## 2014-11-04 ENCOUNTER — Ambulatory Visit (HOSPITAL_COMMUNITY): Payer: Medicare Other | Attending: Cardiovascular Disease

## 2014-11-04 ENCOUNTER — Encounter: Payer: Self-pay | Admitting: Cardiovascular Disease

## 2014-11-04 VITALS — BP 138/78 | HR 57 | Ht 71.0 in | Wt 219.0 lb

## 2014-11-04 DIAGNOSIS — I6521 Occlusion and stenosis of right carotid artery: Secondary | ICD-10-CM

## 2014-11-04 DIAGNOSIS — I2510 Atherosclerotic heart disease of native coronary artery without angina pectoris: Secondary | ICD-10-CM

## 2014-11-04 DIAGNOSIS — I6523 Occlusion and stenosis of bilateral carotid arteries: Secondary | ICD-10-CM | POA: Insufficient documentation

## 2014-11-04 DIAGNOSIS — E785 Hyperlipidemia, unspecified: Secondary | ICD-10-CM | POA: Diagnosis not present

## 2014-11-04 NOTE — Progress Notes (Signed)
Cardiology Office Note Date:  11/04/2014   ID:  Christopher Perez, DOB 26-Feb-1946, MRN [id number]  PCP:  Ann Held, MD  Cardiologist:  Sherren Mocha, MD    Chief Complaint  Patient presents with  . Muscle Pain     History of Present Illness: Christopher Perez is a 69 y.o. male who presents for follow-up evaluation. He's followed for coronary artery disease. He had exertional angina back in 2007 and had a high risk stress test result demonstrating a large area of inferolateral ischemia. He underwent PCI of a large obtuse marginal branch of the left circumflex. He's done well since that time with no recurrent anginal symptoms.   The patient is doing relatively well. He has some chronic knee problems. Otherwise reports minimal symptoms. He denies chest pain, chest pressure, shortness of breath, edema, orthopnea, heart palpitations, or PND.  Past Medical History  Diagnosis Date  . CAD (coronary artery disease)   . HTN (hypertension)   . HLD (hyperlipidemia)     History reviewed. No pertinent past surgical history.  Current Outpatient Prescriptions  Medication Sig Dispense Refill  . aspirin 81 MG tablet Take 162 mg by mouth daily.      Marland Kitchen dicyclomine (BENTYL) 20 MG tablet Take 20 mg by mouth as needed for spasms (STOMACH).     Marland Kitchen metoprolol succinate (TOPROL-XL) 50 MG 24 hr tablet TAKE ONE TABLET BY MOUTH ONCE DAILY 90 tablet 3  . naproxen sodium (ANAPROX) 220 MG tablet Take 220 mg by mouth as needed (JOINT PAIN). (ALEVE)    . zinc gluconate 50 MG tablet Take 50 mg by mouth daily.       No current facility-administered medications for this visit.    Allergies:   Statins   Social History:  The patient  reports that he quit smoking about 31 years ago. He does not have any smokeless tobacco history on file. He reports that he drinks about 7.0 oz of alcohol per week.   Family History:  The patient's family history is negative for Heart attack, Stroke, and Hypertension.   ROS:   Please see the history of present illness.  Otherwise, review of systems is positive for snoring, joint swelling, back pain, muscle pain.  All other systems are reviewed and negative.   PHYSICAL EXAM: VS:  BP 138/78 mmHg  Pulse 57  Ht 5\' 11"  (1.803 m)  Wt 219 lb (99.338 kg)  BMI 30.56 kg/m2 , BMI Body mass index is 30.56 kg/(m^2). GEN: Well nourished, well developed, in no acute distress HEENT: normal Neck: no JVD, no masses. Right carotid bruit is present Cardiac: RRR without murmur or gallop                Respiratory:  clear to auscultation bilaterally, normal work of breathing GI: soft, nontender, nondistended, + BS MS: no deformity or atrophy Ext: no pretibial edema, pedal pulses 2+= bilaterally Skin: warm and dry, no rash Neuro:  Strength and sensation are intact Psych: euthymic mood, full affect  EKG:  EKG is ordered today. The ekg ordered today shows sinus brady 57 bpm, nonspecific ST abnormality  Recent Labs: 11/05/2013: ALT 18   Lipid Panel     Component Value Date/Time   CHOL 315* 11/05/2013 1108   TRIG 279.0* 11/05/2013 1108   HDL 44.90 11/05/2013 1108   CHOLHDL 7 11/05/2013 1108   VLDL 55.8* 11/05/2013 1108   LDLDIRECT 218.4 11/05/2013 1108      Wt Readings from Last 3 Encounters:  11/04/14  219 lb (99.338 kg)  11/10/13 218 lb (98.884 kg)  11/05/13 217 lb (98.431 kg)     ASSESSMENT AND PLAN: 1.  CAD, native vessel: Stable without symptoms of angina. He will continue on aspirin and metoprolol. I will see him back in one year. He was encouraged to increase exercise as much as tolerated.  2. Hyperlipidemia: I'm concerned about his untreated hyperlipidemia. His total cholesterol is over 300. He has tried multiple statin drugs even at low doses and has been unable to tolerate due to severe myalgias. He has previously been evaluated for a PCSK9 inhibitor and cost was prohibited. This was one year ago so I will reach back out to our Lipid Clinic to see if any  coverage changes have occurred.  Lifestyle modification reviewed.  3. HTN: Blood pressure well controlled on metoprolol succinate.  4. Carotid stenosis: He has had mild carotid disease. He does have a right carotid bruit. He had a duplex scan today and we await results. No stroke or TIA symptoms. Continue antiplatelets therapy with aspirin.   Current medicines are reviewed with the patient today.  The patient does not have concerns regarding medicines.  Labs/ tests ordered today include:  No orders of the defined types were placed in this encounter.    Disposition:   FU one year  Signed, Sherren Mocha, MD  11/04/2014 3:36 PM    [facility] [address], [place], [place]  [address] Phone: [phone]; Fax: [phone]

## 2014-11-04 NOTE — Patient Instructions (Signed)
Medication Instructions:  Your physician recommends that you continue on your current medications as directed. Please refer to the Current Medication list given to you today.  Labwork: Your physician recommends that you return for a FASTING LIPID and LIVER--nothing to eat or drink after midnight, lab opens at 7:30 AM  Testing/Procedures: No new orders.   Follow-Up: Your physician wants you to follow-up in: 1 YEAR with Dr Burt Knack.  You will receive a reminder letter in the mail two months in advance. If you don't receive a letter, please call our office to schedule the follow-up appointment.   Any Other Special Instructions Will Be Listed Below (If Applicable).

## 2014-11-06 ENCOUNTER — Encounter: Payer: Self-pay | Admitting: Cardiovascular Disease

## 2014-11-09 ENCOUNTER — Other Ambulatory Visit (INDEPENDENT_AMBULATORY_CARE_PROVIDER_SITE_OTHER): Payer: Medicare Other | Admitting: *Deleted

## 2014-11-09 DIAGNOSIS — E785 Hyperlipidemia, unspecified: Secondary | ICD-10-CM | POA: Diagnosis not present

## 2014-11-09 DIAGNOSIS — I2510 Atherosclerotic heart disease of native coronary artery without angina pectoris: Secondary | ICD-10-CM | POA: Diagnosis not present

## 2014-11-09 LAB — HEPATIC FUNCTION PANEL
ALBUMIN: 4 g/dL (ref 3.5–5.2)
ALK PHOS: 48 U/L (ref 39–117)
ALT: 17 U/L (ref 0–53)
AST: 18 U/L (ref 0–37)
BILIRUBIN TOTAL: 0.4 mg/dL (ref 0.2–1.2)
Bilirubin, Direct: 0.1 mg/dL (ref 0.0–0.3)
Total Protein: 7.3 g/dL (ref 6.0–8.3)

## 2014-11-09 LAB — LIPID PANEL
Cholesterol: 266 mg/dL — ABNORMAL HIGH (ref 0–200)
HDL: 49.8 mg/dL (ref >39.00)
NONHDL: 215.88
Total CHOL/HDL Ratio: 5
Triglycerides: 205 mg/dL — ABNORMAL HIGH (ref 0.0–149.0)
VLDL: 41 mg/dL — AB (ref 0.0–40.0)

## 2014-11-09 LAB — LDL CHOLESTEROL, DIRECT: Direct LDL: 188 mg/dL

## 2014-11-09 NOTE — Addendum Note (Signed)
Addended by: Eulis Foster on: 11/09/2014 07:34 AM   Modules accepted: Orders

## 2014-11-25 ENCOUNTER — Ambulatory Visit (INDEPENDENT_AMBULATORY_CARE_PROVIDER_SITE_OTHER): Payer: Medicare Other | Admitting: Pharmacist

## 2014-11-25 DIAGNOSIS — I6521 Occlusion and stenosis of right carotid artery: Secondary | ICD-10-CM | POA: Diagnosis not present

## 2014-11-25 DIAGNOSIS — E785 Hyperlipidemia, unspecified: Secondary | ICD-10-CM | POA: Diagnosis not present

## 2014-11-25 NOTE — Progress Notes (Signed)
Patient ID: Christopher Perez         DOB: [birth year], 69 yo                 MRN: [id number]     HPI: Christopher Perez is a 69 y.o. male patient referred to lipid clinic by Dr. Burt Knack given his history of CAD, PCI in 2007, LDL > 200 mg/dL at baseline, and inability to tolerate multiple statins. He was previously seen in lipid clinic last year, during which time he failed to qualify for the [facility] trial. Patient has reported myalgias and joint pain with Zetia and 5 different statins and presents today to discuss other options to lower his LDL.  Current Medications: none Intolerances: Zetia, Vytorin, Livalo 2mg  daily, pravastatin 40mg  daily (2008), atorvastatin unknown dose (10-15 years ago), Crestor 10mg  daily (2008), Crestor 5mg  TIW (03/2013) - all caused myalgias and joint aches Risk Factors: PCI in 2007, CAD, carotid stenosis, HTN, age LDL goal: <70 mg/dL  Diet: Breakfast - oatmeal, eggs, grits, toast. Lunch - salad, fruit, nuts. Dinner - broiled chicken, vegetables. Rarely eats at restaurants, has a vegetable garden at home with his wife.   Exercise: Used to walk 2 miles a day before knee surgery but is more limited now. Spends time gardening.  Family History: No significant history of cardiac disease  Social History: Patient quit smoking about 31 years ago. He does not have any smokeless tobacco history on file. He reports that he drinks about 7 oz of alcohol per week.  Labs: 11/2014: TC 266, TG 205, HDL 49.8, LDL 188, LFTs normal (no therapy) 10/2013: TC 315, TG 279, HDL 45, LDL 218, LFTs normal (no therapy)  Past Medical History  Diagnosis Date  . CAD (coronary artery disease)   . HTN (hypertension)   . HLD (hyperlipidemia)     Current Outpatient Prescriptions on File Prior to Visit  Medication Sig Dispense Refill  . aspirin 81 MG tablet Take 162 mg by mouth daily.      Marland Kitchen dicyclomine (BENTYL) 20 MG tablet Take 20 mg by mouth as needed for spasms (STOMACH).     Marland Kitchen metoprolol  succinate (TOPROL-XL) 50 MG 24 hr tablet TAKE ONE TABLET BY MOUTH ONCE DAILY 90 tablet 3  . naproxen sodium (ANAPROX) 220 MG tablet Take 220 mg by mouth as needed (JOINT PAIN). (ALEVE)    . zinc gluconate 50 MG tablet Take 50 mg by mouth daily.       No current facility-administered medications on file prior to visit.    Allergies  Allergen Reactions  . Statins     Vytorin, Livalo 2 mg qd, Pravastatin 40 mg qd (2008), Crestor 10 mg qd (2008), Crestor 5 mg tiw (03/2013) - muscle aches    Assessment/Plan: 1. Hyperlipidemia - Patient has extensive history of intolerance to 5 different statins and Zetia with myalgias and joint pain. LDL goal < 70mg /dL given history of CAD, carotid stenosis, and PCI. His current LDL off lipid-lowering therapy is 188mg /dL, well above goal. Given multiple statin intolerances, will pursue PCSK9i therapy and begin paperwork for Praluent coverage.   Esmirna Ravan E. Yi Falletta, PharmD [facility] [address], [place], [place] [address] Phone: [phone]; Fax: [phone] 11/25/2014 8:50 AM

## 2014-11-29 ENCOUNTER — Telehealth: Payer: Self-pay | Admitting: Pharmacist

## 2014-11-29 DIAGNOSIS — E785 Hyperlipidemia, unspecified: Secondary | ICD-10-CM

## 2014-11-29 MED ORDER — ALIROCUMAB 75 MG/ML ~~LOC~~ SOPN: 1.0000 "pen " | PEN_INJECTOR | SUBCUTANEOUS | Status: DC

## 2014-11-29 NOTE — Telephone Encounter (Signed)
Spoke with patient's wife - Praluent approved quickly by [employer] Informed her to have pt let us know when he receives his first shipment of drug in case he wants to administer his first injection in clinic. Will also schedule f/u labs 3 months out once he starts on therapy.

## 2014-12-03 NOTE — Telephone Encounter (Signed)
Patient received a call from [facility] letting him know his Praluent copay would be $846 per month. Based on income, patient will not qualify for PAN foundation for medication assistance. Encouraged patient to do some insurance plan research once enrollment opens to see if there is a different plan that would have better coverage of Praluent starting in January. Will provide patient with samples of Praluent in the interim as he is at high risk for a future event (has already had PCI, current LDL 188). Patient will come in mid next week for samples.

## 2014-12-23 ENCOUNTER — Telehealth: Payer: Self-pay | Admitting: Cardiovascular Disease

## 2014-12-23 NOTE — Telephone Encounter (Signed)
Spoke with pt.  He had questions about the patient assistance foundation.  Unfortunately enrollment was closed yesterday.  Will let patient know if they start accepting new applications again.

## 2014-12-23 NOTE — Telephone Encounter (Signed)
New message     Talk to Gay Filler.  Want info on praluent

## 2015-01-04 DIAGNOSIS — Z23 Encounter for immunization: Secondary | ICD-10-CM | POA: Diagnosis not present

## 2015-01-18 ENCOUNTER — Other Ambulatory Visit (INDEPENDENT_AMBULATORY_CARE_PROVIDER_SITE_OTHER): Payer: Medicare Other | Admitting: *Deleted

## 2015-01-18 DIAGNOSIS — I10 Essential (primary) hypertension: Secondary | ICD-10-CM

## 2015-01-18 LAB — LIPID PANEL
Cholesterol: 175 mg/dL (ref 125–200)
HDL: 51 mg/dL (ref >=40)
LDL CALC: 86 mg/dL (ref <130)
TRIGLYCERIDES: 192 mg/dL — AB (ref <150)
Total CHOL/HDL Ratio: 3.4 Ratio (ref <=5.0)
VLDL: 38 mg/dL — ABNORMAL HIGH (ref <30)

## 2015-01-18 LAB — HEPATIC FUNCTION PANEL
ALT: 16 U/L (ref 9–46)
AST: 22 U/L (ref 10–35)
Albumin: 3.9 g/dL (ref 3.6–5.1)
Alkaline Phosphatase: 50 U/L (ref 40–115)
BILIRUBIN INDIRECT: 0.5 mg/dL (ref 0.2–1.2)
Bilirubin, Direct: 0.1 mg/dL (ref <=0.2)
Total Bilirubin: 0.6 mg/dL (ref 0.2–1.2)
Total Protein: 7.3 g/dL (ref 6.1–8.1)

## 2015-02-22 ENCOUNTER — Telehealth: Payer: Self-pay | Admitting: Cardiovascular Disease

## 2015-02-24 DIAGNOSIS — M25552 Pain in left hip: Secondary | ICD-10-CM | POA: Diagnosis not present

## 2015-02-24 DIAGNOSIS — M47816 Spondylosis without myelopathy or radiculopathy, lumbar region: Secondary | ICD-10-CM | POA: Diagnosis not present

## 2015-02-28 DIAGNOSIS — M9905 Segmental and somatic dysfunction of pelvic region: Secondary | ICD-10-CM | POA: Diagnosis not present

## 2015-02-28 DIAGNOSIS — M9903 Segmental and somatic dysfunction of lumbar region: Secondary | ICD-10-CM | POA: Diagnosis not present

## 2015-02-28 DIAGNOSIS — M545 Low back pain: Secondary | ICD-10-CM | POA: Diagnosis not present

## 2015-03-07 DIAGNOSIS — M9903 Segmental and somatic dysfunction of lumbar region: Secondary | ICD-10-CM | POA: Diagnosis not present

## 2015-03-07 DIAGNOSIS — M545 Low back pain: Secondary | ICD-10-CM | POA: Diagnosis not present

## 2015-03-07 DIAGNOSIS — M9905 Segmental and somatic dysfunction of pelvic region: Secondary | ICD-10-CM | POA: Diagnosis not present

## 2015-03-11 DIAGNOSIS — M9905 Segmental and somatic dysfunction of pelvic region: Secondary | ICD-10-CM | POA: Diagnosis not present

## 2015-03-11 DIAGNOSIS — M9903 Segmental and somatic dysfunction of lumbar region: Secondary | ICD-10-CM | POA: Diagnosis not present

## 2015-03-11 DIAGNOSIS — M545 Low back pain: Secondary | ICD-10-CM | POA: Diagnosis not present

## 2015-03-16 DIAGNOSIS — M4806 Spinal stenosis, lumbar region: Secondary | ICD-10-CM | POA: Diagnosis not present

## 2015-03-16 DIAGNOSIS — M9903 Segmental and somatic dysfunction of lumbar region: Secondary | ICD-10-CM | POA: Diagnosis not present

## 2015-03-16 DIAGNOSIS — M545 Low back pain: Secondary | ICD-10-CM | POA: Diagnosis not present

## 2015-03-16 DIAGNOSIS — M5416 Radiculopathy, lumbar region: Secondary | ICD-10-CM | POA: Diagnosis not present

## 2015-03-16 DIAGNOSIS — R350 Frequency of micturition: Secondary | ICD-10-CM | POA: Diagnosis not present

## 2015-03-16 DIAGNOSIS — M9905 Segmental and somatic dysfunction of pelvic region: Secondary | ICD-10-CM | POA: Diagnosis not present

## 2015-03-22 ENCOUNTER — Telehealth: Payer: Self-pay | Admitting: Cardiovascular Disease

## 2015-03-22 NOTE — Telephone Encounter (Signed)
New message      Calling to see if Christopher Perez can get him some praluent samples

## 2015-03-22 NOTE — Telephone Encounter (Signed)
Returned patient's call - he will pick up Praluent samples this week.

## 2015-03-25 NOTE — Telephone Encounter (Signed)
error 

## 2015-04-19 ENCOUNTER — Telehealth: Payer: Self-pay | Admitting: Pharmacist

## 2015-04-19 NOTE — Telephone Encounter (Signed)
Pt called to pick up Praluent samples. Samples left out front for pt to pick up within the hour.

## 2015-05-24 DIAGNOSIS — Z6831 Body mass index (BMI) 31.0-31.9, adult: Secondary | ICD-10-CM | POA: Diagnosis not present

## 2015-05-24 DIAGNOSIS — M4806 Spinal stenosis, lumbar region: Secondary | ICD-10-CM | POA: Diagnosis not present

## 2015-05-24 DIAGNOSIS — M545 Low back pain: Secondary | ICD-10-CM | POA: Diagnosis not present

## 2015-05-24 DIAGNOSIS — M5416 Radiculopathy, lumbar region: Secondary | ICD-10-CM | POA: Diagnosis not present

## 2015-05-24 DIAGNOSIS — I10 Essential (primary) hypertension: Secondary | ICD-10-CM | POA: Diagnosis not present

## 2015-05-26 DIAGNOSIS — M4806 Spinal stenosis, lumbar region: Secondary | ICD-10-CM | POA: Diagnosis not present

## 2015-05-26 DIAGNOSIS — M5416 Radiculopathy, lumbar region: Secondary | ICD-10-CM | POA: Diagnosis not present

## 2015-05-26 DIAGNOSIS — Z135 Encounter for screening for eye and ear disorders: Secondary | ICD-10-CM | POA: Diagnosis not present

## 2015-05-26 DIAGNOSIS — M5126 Other intervertebral disc displacement, lumbar region: Secondary | ICD-10-CM | POA: Diagnosis not present

## 2015-06-03 DIAGNOSIS — M4316 Spondylolisthesis, lumbar region: Secondary | ICD-10-CM | POA: Diagnosis not present

## 2015-06-03 DIAGNOSIS — M545 Low back pain: Secondary | ICD-10-CM | POA: Diagnosis not present

## 2015-06-03 DIAGNOSIS — Z6831 Body mass index (BMI) 31.0-31.9, adult: Secondary | ICD-10-CM | POA: Diagnosis not present

## 2015-06-03 DIAGNOSIS — I10 Essential (primary) hypertension: Secondary | ICD-10-CM | POA: Diagnosis not present

## 2015-06-08 ENCOUNTER — Other Ambulatory Visit: Payer: Self-pay | Admitting: Neurosurgery

## 2015-06-28 NOTE — Pre-Procedure Instructions (Addendum)
    Christopher Perez  06/28/2015      [facility] - [place], [place] - [address] [place] [address] Phone: [phone] Fax: [phone]  [place], [place] [id number],  [id number] [place] [address] Phone: [phone] Fax: [phone]    Your procedure is scheduled on Wednesday, March 29.  Report to [facility] Admitting at 9:15  A.M.                 Your surgery or procedure is scheduled for 12:20 PM    Call this number if you have problems the morning of surgery:[phone]                For any other questions, please call [phone], Monday - Friday 8 AM - 4 PM.   Remember:  Do not eat food or drink liquids after midnight Tuesday, March 28.  Take these medicines the morning of surgery with A SIP OF WATER :metoprolol (LOPRESSOR).                 If needed dicyclomine (BENTYL).             (STOP ASPIRIN, ASPIRIN PRODUCTS, NAPROXEN/ ANAPROX/ ALEVE, HERBAL MEDICINES, VITAMINS)   Do not wear jewelry, make-up or nail polish.  Do not wear lotions, powders, or perfumes.     Men may shave face and neck.  Do not bring valuables to the hospital.  [facility] is not responsible for any belongings or valuables.  Contacts, dentures or bridgework may not be worn into surgery.  Leave your suitcase in the car.  After surgery it may be brought to your room.  For patients admitted to the hospital, discharge time will be determined by your treatment team.  Patients discharged the day of surgery will not be allowed to drive home.   Name and phone number of your driver:     Special instructions: -SEE PREPARING FOR SURGERY  Please read over the following fact sheets that you were given. Pain Booklet, Coughing and Deep Breathing, Blood Transfusion Information, MRSA Information and Surgical Site Infection Prevention

## 2015-06-29 ENCOUNTER — Encounter (HOSPITAL_COMMUNITY)
Admission: RE | Admit: 2015-06-29 | Discharge: 2015-06-29 | Disposition: A | Discharge status: Home / Self Care | Payer: Medicare Other | Source: Ambulatory Visit | Attending: Neurosurgery | Admitting: Neurosurgery

## 2015-06-29 ENCOUNTER — Encounter (HOSPITAL_COMMUNITY): Payer: Self-pay

## 2015-06-29 DIAGNOSIS — M4806 Spinal stenosis, lumbar region: Secondary | ICD-10-CM | POA: Diagnosis not present

## 2015-06-29 DIAGNOSIS — M4316 Spondylolisthesis, lumbar region: Secondary | ICD-10-CM | POA: Diagnosis not present

## 2015-06-29 DIAGNOSIS — Z01812 Encounter for preprocedural laboratory examination: Secondary | ICD-10-CM | POA: Insufficient documentation

## 2015-06-29 DIAGNOSIS — Z0183 Encounter for blood typing: Secondary | ICD-10-CM | POA: Diagnosis not present

## 2015-06-29 HISTORY — DX: Gastro-esophageal reflux disease without esophagitis: K21.9

## 2015-06-29 HISTORY — DX: Myoneural disorder, unspecified: G70.9

## 2015-06-29 HISTORY — DX: Unspecified osteoarthritis, unspecified site: M19.90

## 2015-06-29 HISTORY — DX: Cerebral infarction, unspecified: I63.9

## 2015-06-29 LAB — TYPE AND SCREEN
ABO/RH(D): O POS
Antibody Screen: NEGATIVE

## 2015-06-29 LAB — BASIC METABOLIC PANEL
Anion gap: 11 (ref 5–15)
BUN: 12 mg/dL (ref 6–20)
CALCIUM: 9.6 mg/dL (ref 8.9–10.3)
CHLORIDE: 101 mmol/L (ref 101–111)
CO2: 25 mmol/L (ref 22–32)
CREATININE: 0.78 mg/dL (ref 0.61–1.24)
GFR calc non Af Amer: >60 mL/min (ref >60)
Glucose, Bld: 109 mg/dL — ABNORMAL HIGH (ref 65–99)
Potassium: 3.9 mmol/L (ref 3.5–5.1)
SODIUM: 137 mmol/L (ref 135–145)

## 2015-06-29 LAB — CBC
HCT: 42.2 % (ref 39.0–52.0)
Hemoglobin: 14.2 g/dL (ref 13.0–17.0)
MCH: 29.4 pg (ref 26.0–34.0)
MCHC: 33.6 g/dL (ref 30.0–36.0)
MCV: 87.4 fL (ref 78.0–100.0)
PLATELETS: 240 10*3/uL (ref 150–400)
RBC: 4.83 MIL/uL (ref 4.22–5.81)
RDW: 13.6 % (ref 11.5–15.5)
WBC: 5.9 10*3/uL (ref 4.0–10.5)

## 2015-06-29 LAB — ABO/RH: ABO/RH(D): O POS

## 2015-06-29 LAB — SURGICAL PCR SCREEN
MRSA, PCR: NEGATIVE
STAPHYLOCOCCUS AUREUS: NEGATIVE

## 2015-07-05 MED ORDER — CEFAZOLIN SODIUM-DEXTROSE 2-4 GM/100ML-% IV SOLN
2.0000 g | INTRAVENOUS | Status: AC
  Administered 2015-07-06 (×2): 2 g via INTRAVENOUS
  Filled 2015-07-05: qty 100

## 2015-07-06 ENCOUNTER — Encounter (HOSPITAL_COMMUNITY): Payer: Self-pay | Admitting: *Deleted

## 2015-07-06 ENCOUNTER — Encounter (HOSPITAL_COMMUNITY)
Admission: RE | Disposition: A | Discharge status: Home / Self Care | Payer: Self-pay | Source: Ambulatory Visit | Attending: Neurosurgery

## 2015-07-06 ENCOUNTER — Inpatient Hospital Stay (HOSPITAL_COMMUNITY): Payer: Medicare Other | Admitting: Certified Registered Nurse Anesthetist

## 2015-07-06 ENCOUNTER — Inpatient Hospital Stay (HOSPITAL_COMMUNITY)
Admission: RE | Admit: 2015-07-06 | Discharge: 2015-07-08 | DRG: 460 | Disposition: A | Discharge status: Home / Self Care | Payer: Medicare Other | Source: Ambulatory Visit | Attending: Neurosurgery | Admitting: Neurosurgery

## 2015-07-06 ENCOUNTER — Inpatient Hospital Stay (HOSPITAL_COMMUNITY): Payer: Medicare Other

## 2015-07-06 DIAGNOSIS — Z981 Arthrodesis status: Secondary | ICD-10-CM | POA: Diagnosis not present

## 2015-07-06 DIAGNOSIS — E785 Hyperlipidemia, unspecified: Secondary | ICD-10-CM | POA: Diagnosis present

## 2015-07-06 DIAGNOSIS — M1990 Unspecified osteoarthritis, unspecified site: Secondary | ICD-10-CM | POA: Diagnosis present

## 2015-07-06 DIAGNOSIS — M5416 Radiculopathy, lumbar region: Secondary | ICD-10-CM | POA: Diagnosis present

## 2015-07-06 DIAGNOSIS — Z8673 Personal history of transient ischemic attack (TIA), and cerebral infarction without residual deficits: Secondary | ICD-10-CM | POA: Diagnosis not present

## 2015-07-06 DIAGNOSIS — Z955 Presence of coronary angioplasty implant and graft: Secondary | ICD-10-CM | POA: Diagnosis not present

## 2015-07-06 DIAGNOSIS — Z7982 Long term (current) use of aspirin: Secondary | ICD-10-CM

## 2015-07-06 DIAGNOSIS — Z79899 Other long term (current) drug therapy: Secondary | ICD-10-CM

## 2015-07-06 DIAGNOSIS — K219 Gastro-esophageal reflux disease without esophagitis: Secondary | ICD-10-CM | POA: Diagnosis present

## 2015-07-06 DIAGNOSIS — M4806 Spinal stenosis, lumbar region: Secondary | ICD-10-CM | POA: Diagnosis not present

## 2015-07-06 DIAGNOSIS — M4316 Spondylolisthesis, lumbar region: Secondary | ICD-10-CM | POA: Diagnosis not present

## 2015-07-06 DIAGNOSIS — I10 Essential (primary) hypertension: Secondary | ICD-10-CM | POA: Diagnosis not present

## 2015-07-06 DIAGNOSIS — I2510 Atherosclerotic heart disease of native coronary artery without angina pectoris: Secondary | ICD-10-CM | POA: Diagnosis present

## 2015-07-06 DIAGNOSIS — Z87891 Personal history of nicotine dependence: Secondary | ICD-10-CM | POA: Diagnosis not present

## 2015-07-06 DIAGNOSIS — Z419 Encounter for procedure for purposes other than remedying health state, unspecified: Secondary | ICD-10-CM

## 2015-07-06 DIAGNOSIS — M545 Low back pain: Secondary | ICD-10-CM | POA: Diagnosis not present

## 2015-07-06 SURGERY — POSTERIOR LUMBAR FUSION 1 LEVEL
Anesthesia: General | Site: Spine Lumbar

## 2015-07-06 MED ORDER — MENTHOL 3 MG MT LOZG: 1.0000 | LOZENGE | OROMUCOSAL | Status: DC | PRN

## 2015-07-06 MED ORDER — BUPIVACAINE LIPOSOME 1.3 % IJ SUSP
INTRAMUSCULAR | Status: DC | PRN
  Administered 2015-07-06: 20 mL

## 2015-07-06 MED ORDER — LACTATED RINGERS IV SOLN: INTRAVENOUS | Status: DC

## 2015-07-06 MED ORDER — FENTANYL CITRATE (PF) 100 MCG/2ML IJ SOLN
INTRAMUSCULAR | Status: DC | PRN
  Administered 2015-07-06: 50 ug via INTRAVENOUS
  Administered 2015-07-06: 100 ug via INTRAVENOUS
  Administered 2015-07-06 (×2): 50 ug via INTRAVENOUS

## 2015-07-06 MED ORDER — LACTATED RINGERS IV SOLN
INTRAVENOUS | Status: DC
  Administered 2015-07-06 (×3): via INTRAVENOUS

## 2015-07-06 MED ORDER — OXYCODONE-ACETAMINOPHEN 5-325 MG PO TABS
1.0000 | ORAL_TABLET | ORAL | Status: DC | PRN
  Administered 2015-07-06 – 2015-07-07 (×2): 2 via ORAL
  Administered 2015-07-07: 1 via ORAL
  Filled 2015-07-06 (×4): qty 2

## 2015-07-06 MED ORDER — ONDANSETRON HCL 4 MG/2ML IJ SOLN
INTRAMUSCULAR | Status: DC | PRN
  Administered 2015-07-06: 4 mg via INTRAVENOUS

## 2015-07-06 MED ORDER — HYDROMORPHONE HCL 1 MG/ML IJ SOLN
INTRAMUSCULAR | Status: AC
  Administered 2015-07-06: 0.5 mg via INTRAVENOUS
  Filled 2015-07-06: qty 1

## 2015-07-06 MED ORDER — ROCURONIUM BROMIDE 50 MG/5ML IV SOLN
INTRAVENOUS | Status: AC
  Filled 2015-07-06: qty 1

## 2015-07-06 MED ORDER — THROMBIN 20000 UNITS EX SOLR
CUTANEOUS | Status: DC | PRN
  Administered 2015-07-06: 12:00:00 via TOPICAL

## 2015-07-06 MED ORDER — METOPROLOL TARTRATE 50 MG PO TABS
50.0000 mg | ORAL_TABLET | Freq: Every day | ORAL | Status: DC
  Administered 2015-07-07 – 2015-07-08 (×2): 50 mg via ORAL
  Filled 2015-07-06 (×2): qty 1

## 2015-07-06 MED ORDER — GLYCOPYRROLATE 0.2 MG/ML IJ SOLN
INTRAMUSCULAR | Status: AC
  Filled 2015-07-06: qty 2

## 2015-07-06 MED ORDER — MORPHINE SULFATE (PF) 2 MG/ML IV SOLN
1.0000 mg | INTRAVENOUS | Status: DC | PRN
  Administered 2015-07-06: 2 mg via INTRAVENOUS
  Administered 2015-07-07 (×2): 4 mg via INTRAVENOUS
  Administered 2015-07-08: 2 mg via INTRAVENOUS
  Administered 2015-07-08: 4 mg via INTRAVENOUS
  Filled 2015-07-06: qty 2
  Filled 2015-07-06: qty 1
  Filled 2015-07-06 (×2): qty 2
  Filled 2015-07-06: qty 1

## 2015-07-06 MED ORDER — ONDANSETRON HCL 4 MG/2ML IJ SOLN
INTRAMUSCULAR | Status: AC
  Filled 2015-07-06: qty 2

## 2015-07-06 MED ORDER — BUPIVACAINE-EPINEPHRINE (PF) 0.5% -1:200000 IJ SOLN
INTRAMUSCULAR | Status: DC | PRN
  Administered 2015-07-06: 10 mL via PERINEURAL

## 2015-07-06 MED ORDER — ONDANSETRON HCL 4 MG/2ML IJ SOLN: 4.0000 mg | INTRAMUSCULAR | Status: DC | PRN

## 2015-07-06 MED ORDER — ROCURONIUM BROMIDE 50 MG/5ML IV SOLN
INTRAVENOUS | Status: AC
  Filled 2015-07-06: qty 2

## 2015-07-06 MED ORDER — ACETAMINOPHEN 325 MG PO TABS: 650.0000 mg | ORAL_TABLET | ORAL | Status: DC | PRN

## 2015-07-06 MED ORDER — PHENYLEPHRINE HCL 10 MG/ML IJ SOLN
INTRAMUSCULAR | Status: AC
  Filled 2015-07-06: qty 2

## 2015-07-06 MED ORDER — STERILE WATER FOR INJECTION IJ SOLN
INTRAMUSCULAR | Status: AC
  Filled 2015-07-06: qty 10

## 2015-07-06 MED ORDER — PROPOFOL 10 MG/ML IV BOLUS
INTRAVENOUS | Status: AC
  Filled 2015-07-06: qty 20

## 2015-07-06 MED ORDER — VANCOMYCIN HCL 1000 MG IV SOLR
INTRAVENOUS | Status: AC
  Filled 2015-07-06: qty 1000

## 2015-07-06 MED ORDER — FENTANYL CITRATE (PF) 250 MCG/5ML IJ SOLN
INTRAMUSCULAR | Status: AC
  Filled 2015-07-06: qty 5

## 2015-07-06 MED ORDER — BISACODYL 10 MG RE SUPP: 10.0000 mg | Freq: Every day | RECTAL | Status: DC | PRN

## 2015-07-06 MED ORDER — EPHEDRINE SULFATE 50 MG/ML IJ SOLN
INTRAMUSCULAR | Status: DC | PRN
  Administered 2015-07-06 (×2): 5 mg via INTRAVENOUS
  Administered 2015-07-06: 10 mg via INTRAVENOUS
  Administered 2015-07-06: 5 mg via INTRAVENOUS

## 2015-07-06 MED ORDER — 0.9 % SODIUM CHLORIDE (POUR BTL) OPTIME
TOPICAL | Status: DC | PRN
  Administered 2015-07-06: 1000 mL

## 2015-07-06 MED ORDER — ACETAMINOPHEN 650 MG RE SUPP: 650.0000 mg | RECTAL | Status: DC | PRN

## 2015-07-06 MED ORDER — PHENYLEPHRINE HCL 10 MG/ML IJ SOLN
INTRAMUSCULAR | Status: DC | PRN
  Administered 2015-07-06: 40 ug via INTRAVENOUS

## 2015-07-06 MED ORDER — EPHEDRINE SULFATE 50 MG/ML IJ SOLN
INTRAMUSCULAR | Status: AC
  Filled 2015-07-06: qty 1

## 2015-07-06 MED ORDER — CEFAZOLIN SODIUM-DEXTROSE 2-4 GM/100ML-% IV SOLN
2.0000 g | Freq: Three times a day (TID) | INTRAVENOUS | Status: AC
  Administered 2015-07-06 – 2015-07-07 (×2): 2 g via INTRAVENOUS
  Filled 2015-07-06 (×2): qty 100

## 2015-07-06 MED ORDER — DIAZEPAM 5 MG PO TABS
5.0000 mg | ORAL_TABLET | Freq: Four times a day (QID) | ORAL | Status: DC | PRN
  Administered 2015-07-06: 5 mg via ORAL
  Filled 2015-07-06: qty 1

## 2015-07-06 MED ORDER — SODIUM CHLORIDE 0.9 % IR SOLN
Status: DC | PRN
  Administered 2015-07-06: 12:00:00

## 2015-07-06 MED ORDER — SUGAMMADEX SODIUM 200 MG/2ML IV SOLN
INTRAVENOUS | Status: AC
  Filled 2015-07-06: qty 2

## 2015-07-06 MED ORDER — PROPOFOL 10 MG/ML IV BOLUS
INTRAVENOUS | Status: DC | PRN
  Administered 2015-07-06: 180 mg via INTRAVENOUS

## 2015-07-06 MED ORDER — SUCCINYLCHOLINE CHLORIDE 20 MG/ML IJ SOLN
INTRAMUSCULAR | Status: AC
  Filled 2015-07-06: qty 1

## 2015-07-06 MED ORDER — VANCOMYCIN HCL 1000 MG IV SOLR
INTRAVENOUS | Status: DC | PRN
  Administered 2015-07-06: 1000 mg via TOPICAL

## 2015-07-06 MED ORDER — GLYCOPYRROLATE 0.2 MG/ML IJ SOLN
INTRAMUSCULAR | Status: DC | PRN
  Administered 2015-07-06: 0.3 mg via INTRAVENOUS

## 2015-07-06 MED ORDER — ROCURONIUM BROMIDE 100 MG/10ML IV SOLN
INTRAVENOUS | Status: DC | PRN
  Administered 2015-07-06: 50 mg via INTRAVENOUS
  Administered 2015-07-06: 20 mg via INTRAVENOUS

## 2015-07-06 MED ORDER — BACITRACIN ZINC 500 UNIT/GM EX OINT
TOPICAL_OINTMENT | CUTANEOUS | Status: DC | PRN
  Administered 2015-07-06: 1 via TOPICAL

## 2015-07-06 MED ORDER — HYDROCODONE-ACETAMINOPHEN 5-325 MG PO TABS: 1.0000 | ORAL_TABLET | ORAL | Status: DC | PRN

## 2015-07-06 MED ORDER — PHENOL 1.4 % MT LIQD: 1.0000 | OROMUCOSAL | Status: DC | PRN

## 2015-07-06 MED ORDER — LIDOCAINE HCL (CARDIAC) 20 MG/ML IV SOLN
INTRAVENOUS | Status: DC | PRN
  Administered 2015-07-06: 100 mg via INTRAVENOUS

## 2015-07-06 MED ORDER — NEOSTIGMINE METHYLSULFATE 10 MG/10ML IV SOLN
INTRAVENOUS | Status: DC | PRN
  Administered 2015-07-06: 2 mg via INTRAVENOUS

## 2015-07-06 MED ORDER — ALUM & MAG HYDROXIDE-SIMETH 200-200-20 MG/5ML PO SUSP
30.0000 mL | Freq: Four times a day (QID) | ORAL | Status: DC | PRN
  Administered 2015-07-06 – 2015-07-07 (×2): 30 mL via ORAL
  Filled 2015-07-06 (×2): qty 30

## 2015-07-06 MED ORDER — BUPIVACAINE LIPOSOME 1.3 % IJ SUSP
20.0000 mL | INTRAMUSCULAR | Status: AC
  Filled 2015-07-06: qty 20

## 2015-07-06 MED ORDER — DEXTROSE 5 % IV SOLN
10.0000 mg | INTRAVENOUS | Status: DC | PRN
  Administered 2015-07-06: 20 ug/min via INTRAVENOUS

## 2015-07-06 MED ORDER — HYDROMORPHONE HCL 1 MG/ML IJ SOLN
0.2500 mg | INTRAMUSCULAR | Status: DC | PRN
  Administered 2015-07-06 (×4): 0.5 mg via INTRAVENOUS

## 2015-07-06 MED ORDER — DOCUSATE SODIUM 100 MG PO CAPS
100.0000 mg | ORAL_CAPSULE | Freq: Two times a day (BID) | ORAL | Status: DC
  Administered 2015-07-06 – 2015-07-07 (×3): 100 mg via ORAL
  Filled 2015-07-06 (×4): qty 1

## 2015-07-06 SURGICAL SUPPLY — 62 items
APL SKNCLS STERI-STRIP NONHPOA (GAUZE/BANDAGES/DRESSINGS) ×1
BAG DECANTER FOR FLEXI CONT (MISCELLANEOUS) ×3 IMPLANT
BENZOIN TINCTURE PRP APPL 2/3 (GAUZE/BANDAGES/DRESSINGS) ×3 IMPLANT
BLADE CLIPPER SURG (BLADE) IMPLANT
BRUSH SCRUB EZ PLAIN DRY (MISCELLANEOUS) ×3 IMPLANT
BUR MATCHSTICK NEURO 3.0 LAGG (BURR) ×3 IMPLANT
BUR PRECISION FLUTE 6.0 (BURR) ×5 IMPLANT
BUR RND OSTEON ELITE 6.0 (BURR) ×1 IMPLANT
BUR RND OSTEON ELITE 6.0MM (BURR) ×1
CANISTER SUCT 3000ML PPV (MISCELLANEOUS) ×3 IMPLANT
CAP REVERE LOCKING (Cap) ×8 IMPLANT
CLOSURE WOUND 1/2 X4 (GAUZE/BANDAGES/DRESSINGS) ×1
CONT SPEC 4OZ CLIKSEAL STRL BL (MISCELLANEOUS) ×3 IMPLANT
COVER BACK TABLE 60X90IN (DRAPES) ×3 IMPLANT
DRAPE C-ARM 42X72 X-RAY (DRAPES) ×6 IMPLANT
DRAPE LAPAROTOMY 100X72X124 (DRAPES) ×3 IMPLANT
DRAPE POUCH INSTRU U-SHP 10X18 (DRAPES) ×3 IMPLANT
DRAPE PROXIMA HALF (DRAPES) ×3 IMPLANT
DRAPE SURG 17X23 STRL (DRAPES) ×12 IMPLANT
ELECT BLADE 4.0 EZ CLEAN MEGAD (MISCELLANEOUS) ×3
ELECT REM PT RETURN 9FT ADLT (ELECTROSURGICAL) ×3
ELECTRODE BLDE 4.0 EZ CLN MEGD (MISCELLANEOUS) ×1 IMPLANT
ELECTRODE REM PT RTRN 9FT ADLT (ELECTROSURGICAL) ×1 IMPLANT
EVACUATOR 1/8 PVC DRAIN (DRAIN) IMPLANT
GAUZE SPONGE 4X4 12PLY STRL (GAUZE/BANDAGES/DRESSINGS) ×3 IMPLANT
GAUZE SPONGE 4X4 16PLY XRAY LF (GAUZE/BANDAGES/DRESSINGS) ×3 IMPLANT
GLOVE BIO SURGEON STRL SZ8 (GLOVE) ×8 IMPLANT
GLOVE BIO SURGEON STRL SZ8.5 (GLOVE) ×6 IMPLANT
GOWN STRL REUS W/ TWL LRG LVL3 (GOWN DISPOSABLE) IMPLANT
GOWN STRL REUS W/ TWL XL LVL3 (GOWN DISPOSABLE) ×2 IMPLANT
GOWN STRL REUS W/TWL 2XL LVL3 (GOWN DISPOSABLE) IMPLANT
GOWN STRL REUS W/TWL LRG LVL3 (GOWN DISPOSABLE) ×6
GOWN STRL REUS W/TWL XL LVL3 (GOWN DISPOSABLE) ×6
KIT BASIN OR (CUSTOM PROCEDURE TRAY) ×3 IMPLANT
KIT ROOM TURNOVER OR (KITS) ×3 IMPLANT
NDL HYPO 21X1.5 SAFETY (NEEDLE) IMPLANT
NEEDLE HYPO 21X1.5 SAFETY (NEEDLE) IMPLANT
NEEDLE HYPO 22GX1.5 SAFETY (NEEDLE) ×3 IMPLANT
NS IRRIG 1000ML POUR BTL (IV SOLUTION) ×3 IMPLANT
PACK LAMINECTOMY NEURO (CUSTOM PROCEDURE TRAY) ×3 IMPLANT
PAD ARMBOARD 7.5X6 YLW CONV (MISCELLANEOUS) ×9 IMPLANT
PATTIES SURGICAL .5 X1 (DISPOSABLE) IMPLANT
ROD REVERE 6.35 40MM (Rod) ×4 IMPLANT
SCREW 7.5X50MM (Screw) ×8 IMPLANT
SPACER ALTERA 10X31 10-14-8 (Spacer) ×2 IMPLANT
SPONGE LAP 4X18 X RAY DECT (DISPOSABLE) IMPLANT
SPONGE NEURO XRAY DETECT 1X3 (DISPOSABLE) IMPLANT
SPONGE SURGIFOAM ABS GEL 100 (HEMOSTASIS) ×3 IMPLANT
STRIP BIOACTIVE 20CC 25X100X8 (Miscellaneous) ×2 IMPLANT
STRIP BIOACTIVE 5CC 25X50X4MM (Miscellaneous) ×2 IMPLANT
STRIP CLOSURE SKIN 1/2X4 (GAUZE/BANDAGES/DRESSINGS) ×2 IMPLANT
SUT VIC AB 1 CT1 18XBRD ANBCTR (SUTURE) ×2 IMPLANT
SUT VIC AB 1 CT1 8-18 (SUTURE) ×6
SUT VIC AB 2-0 CP2 18 (SUTURE) ×6 IMPLANT
SYR 20CC LL (SYRINGE) ×2 IMPLANT
TAPE CLOTH SURG 4X10 WHT LF (GAUZE/BANDAGES/DRESSINGS) ×2 IMPLANT
TOWEL OR 17X24 6PK STRL BLUE (TOWEL DISPOSABLE) ×3 IMPLANT
TOWEL OR 17X26 10 PK STRL BLUE (TOWEL DISPOSABLE) ×3 IMPLANT
TRAY FOLEY SILVER 16FR TEMP (SET/KITS/TRAYS/PACK) ×2 IMPLANT
TUBE CONNECTING 12'X1/4 (SUCTIONS) ×1
TUBE CONNECTING 12X1/4 (SUCTIONS) ×1 IMPLANT
WATER STERILE IRR 1000ML POUR (IV SOLUTION) ×3 IMPLANT

## 2015-07-06 NOTE — Anesthesia Procedure Notes (Signed)
Procedure Name: Intubation Date/Time: 07/06/2015 11:33 AM Performed by: Shirlyn Goltz Pre-anesthesia Checklist: Patient identified, Emergency Drugs available, Suction available and Patient being monitored Patient Re-evaluated:Patient Re-evaluated prior to inductionOxygen Delivery Method: Circle system utilized Preoxygenation: Pre-oxygenation with 100% oxygen Intubation Type: IV induction Ventilation: Mask ventilation without difficulty and Oral airway inserted - appropriate to patient size Laryngoscope size: grade 4 view with mac 4; grade 1 view with glidescope large  Tube type: Oral Tube size: 7.5 mm Number of attempts: 1 Airway Equipment and Method: Stylet and Video-laryngoscopy Placement Confirmation: ETT inserted through vocal cords under direct vision,  positive ETCO2 and breath sounds checked- equal and bilateral Secured at: 23 cm Tube secured with: Tape Dental Injury: Teeth and Oropharynx as per pre-operative assessment

## 2015-07-06 NOTE — Anesthesia Postprocedure Evaluation (Deleted)
Anesthesia Post Note  Patient: Christopher Perez  Procedure(s) Performed: Procedure(s) (LRB): LUMBAR FOUR-FIVE POSTERIOR LUMBAR INTERBODY FUSION (N/A)  Patient location during evaluation: PACU Anesthesia Type: General Level of consciousness: sedated Pain management: satisfactory to patient Vital Signs Assessment: post-procedure vital signs reviewed and stable Respiratory status: spontaneous breathing Cardiovascular status: stable Anesthetic complications: no    Last Vitals:  Filed Vitals:   07/06/15 0926  BP: 147/78  Pulse: 50  Temp: 36.6 C  Resp: 20    Last Pain: There were no vitals filed for this visit.               Riccardo Dubin

## 2015-07-06 NOTE — Anesthesia Preprocedure Evaluation (Signed)
Anesthesia Evaluation  Patient identified by MRN, date of birth, ID band Patient awake    Reviewed: Allergy & Precautions, H&P , Patient's Chart, lab work & pertinent test results, reviewed documented beta blocker date and time   Airway Mallampati: II  TM Distance: >3 FB Neck ROM: full    Dental no notable dental hx.    Pulmonary former smoker,    Pulmonary exam normal breath sounds clear to auscultation       Cardiovascular hypertension,  Rhythm:regular Rate:Normal     Neuro/Psych    GI/Hepatic   Endo/Other    Renal/GU      Musculoskeletal   Abdominal   Peds  Hematology   Anesthesia Other Findings   Reproductive/Obstetrics                           Anesthesia Physical Anesthesia Plan  ASA: II  Anesthesia Plan: General   Post-op Pain Management:    Induction: Intravenous  Airway Management Planned: Oral ETT  Additional Equipment:   Intra-op Plan:   Post-operative Plan: Extubation in OR  Informed Consent: I have reviewed the patients History and Physical, chart, labs and discussed the procedure including the risks, benefits and alternatives for the proposed anesthesia with the patient or authorized representative who has indicated his/her understanding and acceptance.   Dental Advisory Given and Dental advisory given  Plan Discussed with: CRNA and Surgeon  Anesthesia Plan Comments: (  Discussed general anesthesia, including possible nausea, instrumentation of airway, sore throat,pulmonary aspiration, etc. I asked if the were any outstanding questions, or  concerns before we proceeded. )        Anesthesia Quick Evaluation  

## 2015-07-06 NOTE — Progress Notes (Signed)
Orthopedic Tech Progress Note Patient Details:  Christopher Perez 1945-10-01 [id number] Called in brace order to [employer]. Patient ID: Christopher Perez, male   DOB: 01/03/1946, 70 y.o.   MRN: [id number]   Christopher Perez 07/06/2015, 3:59 PM

## 2015-07-06 NOTE — Op Note (Signed)
Brief history: The patient is a 70 year old white male who has complained of back, buttock, and leg pain consistent with neurogenic claudication. He has failed medical management and was worked up with lumbar x-rays and lumbar MRI. These demonstrated a lumbar spondylolisthesis and spinal stenosis. I discussed the situation with the patient. We discussed the various treatment options. He has decided to proceed with surgery.  Preoperative diagnosis: L4-5 spondylolisthesis, spinal stenosis compressing both the L4 and the L5 nerve roots; lumbago; lumbar radiculopathy  Postoperative diagnosis: Same  Procedure: Bilateral L4-5 Laminotomy/foraminotomies to decompress the bilateral L4 and L5 nerve roots(the work required to do this was in addition to the work required to do the posterior lumbar interbody fusion because of the patient's spinal stenosis, facet arthropathy. Etc. requiring a wide decompression of the nerve roots.); L4-5 transforaminal lumbar interbody fusion with local morselized autograft bone and Kinnex graft extender; insertion of interbody prosthesis at L4-5 (globus peek expandable interbody prosthesis); posterior nonsegmental instrumentation from L4 to L5 with globus titanium pedicle screws and rods; posterior lateral arthrodesis at L4-5 with local morselized autograft bone and Kinnex bone graft extender.  Surgeon: Dr. Earle Gell  Asst.: Dr. Sherley Bounds  Anesthesia: Gen. endotracheal  Estimated blood loss: 200 mL  Drains: None  Complications: None  Description of procedure: The patient was brought to the operating room by the anesthesia team. General endotracheal anesthesia was induced. The patient was turned to the prone position on the Wilson frame. The patient's lumbosacral region was then prepared with Betadine scrub and Betadine solution. Sterile drapes were applied.  I then injected the area to be incised with Marcaine with epinephrine solution. I then used the scalpel to make  a linear midline incision over the L4-5 interspace. I then used electrocautery to perform a bilateral subperiosteal dissection exposing the spinous process and lamina of L4 and L5. We then obtained intraoperative radiograph to confirm our location. We then inserted the Verstrac retractor to provide exposure.  I began the decompression by using the high speed drill to perform laminotomies at L4-5 bilaterally. We then used the Kerrison punches to widen the laminotomy and removed the ligamentum flavum at L4-5 bilaterally. We used the Kerrison punches to remove the medial facets at L4-5 bilaterally. We performed wide foraminotomies about the bilateral L4 and L5 nerve roots completing the decompression.  We now turned our attention to the posterior lumbar interbody fusion. I used a scalpel to incise the intervertebral disc at L4-5 bilaterally. I then performed a partial intervertebral discectomy at L4-5 bilaterally using the pituitary forceps. We prepared the vertebral endplates at 075-GRM bilaterally for the fusion by removing the soft tissues with the curettes. We then used the trial spacers to pick the appropriate sized interbody prosthesis. We prefilled his prosthesis with a combination of local morselized autograft bone that we obtained during the decompression as well as Kinnex bone graft extender. We inserted the prefilled prosthesis into the interspace at L4-5, we then expanded the prosthesis. There was a good snug fit of the prosthesis in the interspace. We then filled and the remainder of the intervertebral disc space with local morselized autograft bone and Kinnex. This completed the posterior lumbar interbody arthrodesis.  We now turned attention to the instrumentation. Under fluoroscopic guidance we cannulated the bilateral L4 and L5 pedicles with the bone probe. We then removed the bone probe. We then tapped the pedicle with a 6.5 millimeter tap. We then removed the tap. We probed inside the tapped  pedicle with a ball  probe to rule out cortical breaches. We then inserted a 7.5 x 50 millimeter pedicle screw into the L4 and L5 pedicles bilaterally under fluoroscopic guidance. We then palpated along the medial aspect of the pedicles to rule out cortical breaches. There were none. The nerve roots were not injured. We then connected the unilateral pedicle screws with a lordotic rod. We compressed the construct and secured the rod in place with the caps. We then tightened the caps appropriately. This completed the instrumentation from L4-5.  We now turned our attention to the posterior lateral arthrodesis at L4-5. We used the high-speed drill to decorticate the remainder of the facets, pars, transverse process at L4-5 bilaterally. We then applied a combination of local morselized autograft bone and Kinnex bone graft extender over these decorticated posterior lateral structures. This completed the posterior lateral arthrodesis.  We then obtained hemostasis using bipolar electrocautery. We irrigated the wound out with bacitracin solution. We inspected the thecal sac and nerve roots and noted they were well decompressed. We then removed the retractor. We placed vancomycin powder in the wound. We reapproximated patient's thoracolumbar fascia with interrupted #1 Vicryl suture. We reapproximated patient's subcutaneous tissue with interrupted 2-0 Vicryl suture. The reapproximated patient's skin with Steri-Strips and benzoin. The wound was then coated with bacitracin ointment. A sterile dressing was applied. The drapes were removed. The patient was subsequently returned to the supine position where they were extubated by the anesthesia team. He was then transported to the post anesthesia care unit in stable condition. All sponge instrument and needle counts were reportedly correct at the end of this case.

## 2015-07-06 NOTE — Transfer of Care (Signed)
Immediate Anesthesia Transfer of Care Note  Patient: Christopher Perez  Procedure(s) Performed: Procedure(s) with comments: LUMBAR FOUR-FIVE POSTERIOR LUMBAR INTERBODY FUSION (N/A) - L45 decompression with posterior lumbar interbody fusion with interbody prosthesis posterior lateral arthrodesis and posterior nonsegmental instrumentation  Patient Location: PACU  Anesthesia Type:General  Level of Consciousness: awake and alert   Airway & Oxygen Therapy: Patient Spontanous Breathing and Patient connected to nasal cannula oxygen  Post-op Assessment: Report given to RN and Patient moving all extremities X 4  Post vital signs: Reviewed and stable  Last Vitals:  Filed Vitals:   07/06/15 0926  BP: 147/78  Pulse: 50  Temp: 36.6 C  Resp: 20    Complications: No apparent anesthesia complications

## 2015-07-06 NOTE — Progress Notes (Signed)
Pt arrived to 5M19 via stretcher.  Pt ambulated from stretcher to bed without distress.  Pt alert and oriented, pain 5/10.  Diet placed.  Will continue to monitor. Cori Razor, RN

## 2015-07-06 NOTE — Progress Notes (Signed)
Subjective:  The patient is somnolent but easily arousable. He is in no apparent distress. He looks well.  Objective: Vital signs in last 24 hours: Temp:  [97.8 F (36.6 C)] 97.8 F (36.6 C) (03/29 0926) Pulse Rate:  [50] 50 (03/29 0926) Resp:  [20] 20 (03/29 0926) BP: (147)/(78) 147/78 mmHg (03/29 0926) SpO2:  [96 %] 96 % (03/29 0926) Weight:  [97.977 kg (216 lb)] 97.977 kg (216 lb) (03/29 0926)  Intake/Output from previous day:   Intake/Output this shift: Total I/O In: 1000 [I.V.:1000] Out: 415 [Urine:175; Blood:240]  Physical exam the patient is somewhat arousable. He is moving his lower extremities well.  Lab Results: No results for input(s): WBC, HGB, HCT, PLT in the last 72 hours. BMET No results for input(s): NA, K, CL, CO2, GLUCOSE, BUN, CREATININE, CALCIUM in the last 72 hours.  Studies/Results: Dg Lumbar Spine 1 View  07/06/2015  CLINICAL DATA:  Intraoperative localization EXAM: LUMBAR SPINE - 1 VIEW COMPARISON:  05/24/2015 FINDINGS: Retractors and probe centered over the lower L4 spinous process which is at the L4-5 disc level. Spinal numbering is based on preoperative MRI comparison. No acute osseous finding. IMPRESSION: Surgical probe at the L4-5 disc level. Electronically Signed   By: Monte Fantasia M.D.   On: 07/06/2015 14:01    Assessment/Plan: The patient is doing well.  LOS: 0 days     Christal Lagerstrom D 07/06/2015, 2:54 PM

## 2015-07-06 NOTE — H&P (Signed)
Subjective: The patient is a 70 year old male who has complained of back and leg pain consistent with neurogenic claudication. He has failed medical management and was worked up with lumbar x-rays and lumbar MRI. This demonstrated an L4-5 spondylolisthesis with spinal stenosis. I discussed the situation with the patient. We discussed the various treatment options. He has decided to proceed with surgery.`   Past Medical History  Diagnosis Date  . CAD (coronary artery disease)   . HTN (hypertension)   . HLD (hyperlipidemia)   . Arthritis   . Stroke [facility])     ? TIA    5-6 YRS AGO  . Neuromuscular disorder ([place])     NUMB/ TINGLING TOES  . GERD (gastroesophageal reflux disease)     OCC    Past Surgical History  Procedure Laterality Date  . Cardiac catheterization    . Coronary angioplasty      STENT PLACED  03/2006  . Knee arthroscopy      RT KNEE ,  MENISCUS REP  . Foot surgery      RT FOOT    Allergies  Allergen Reactions  . Statins Other (See Comments)    Myalgias; Vytorin, Livalo 2 mg qd, Pravastatin 40 mg qd (2008), Crestor 10 mg qd (2008), Crestor 5 mg tiw (03/2013)     Social History  Substance Use Topics  . Smoking status: Former Smoker    Quit date: 04/10/1983  . Smokeless tobacco: Not on file  . Alcohol Use: 7.0 oz/week    14 Standard drinks or equivalent per week     Comment: OCC    Family History  Problem Relation Age of Onset  . Heart attack Neg Hx   . Stroke Neg Hx   . Hypertension Neg Hx    Prior to Admission medications   Medication Sig Start Date End Date Taking? Authorizing Provider  Alirocumab (PRALUENT) 75 MG/ML SOPN Inject 75 mg into the skin every 14 (fourteen) days. CHOLESTEROL MEDICATION   Yes Historical Provider, MD  aspirin 81 MG tablet Take 81 mg by mouth daily.    Yes Historical Provider, MD  dicyclomine (BENTYL) 20 MG tablet Take 20 mg by mouth as needed for spasms (STOMACH).  08/27/14  Yes Historical Provider, MD  metoprolol (LOPRESSOR) 50  MG tablet Take 50 mg by mouth daily.   Yes Historical Provider, MD  naproxen sodium (ANAPROX) 220 MG tablet Take 220 mg by mouth as needed (JOINT PAIN). (ALEVE)   Yes Historical Provider, MD     Review of Systems  Positive ROS: As above  All other systems have been reviewed and were otherwise negative with the exception of those mentioned in the HPI and as above.  Objective: Vital signs in last 24 hours: Temp:  [97.8 F (36.6 C)] 97.8 F (36.6 C) (03/29 0926) Pulse Rate:  [50] 50 (03/29 0926) Resp:  [20] 20 (03/29 0926) BP: (147)/(78) 147/78 mmHg (03/29 0926) SpO2:  [96 %] 96 % (03/29 0926) Weight:  [97.977 kg (216 lb)] 97.977 kg (216 lb) (03/29 0926)  General Appearance: Alert, cooperative, no distress, Head: Normocephalic, without obvious abnormality, atraumatic Eyes: PERRL, conjunctiva/corneas clear, EOM's intact,    Ears: Normal  Throat: Normal  Neck: Supple, symmetrical, trachea midline, no adenopathy; thyroid: No enlargement/tenderness/nodules; no carotid bruit or JVD Back: Symmetric, no curvature, ROM normal, no CVA tenderness Lungs: Clear to auscultation bilaterally, respirations unlabored Heart: Regular rate and rhythm, no murmur, rub or gallop Abdomen: Soft, non-tender,, no masses, no organomegaly Extremities: Extremities normal,  atraumatic, no cyanosis or edema Pulses: 2+ and symmetric all extremities Skin: Skin color, texture, turgor normal, no rashes or lesions  NEUROLOGIC:   Mental status: alert and oriented, no aphasia, good attention span, Fund of knowledge/ memory ok Motor Exam - grossly normal Sensory Exam - grossly normal Reflexes:  Coordination - grossly normal Gait - grossly normal Balance - grossly normal Cranial Nerves: I: smell Not tested  II: visual acuity  OS: Normal  OD: Normal   II: visual fields Full to confrontation  II: pupils Equal, round, reactive to light  III,VII: ptosis None  III,IV,VI: extraocular muscles  Full ROM  V: mastication  Normal  V: facial light touch sensation  Normal  V,VII: corneal reflex  Present  VII: facial muscle function - upper  Normal  VII: facial muscle function - lower Normal  VIII: hearing Not tested  IX: soft palate elevation  Normal  IX,X: gag reflex Present  XI: trapezius strength  5/5  XI: sternocleidomastoid strength 5/5  XI: neck flexion strength  5/5  XII: tongue strength  Normal    Data Review Lab Results  Component Value Date   WBC 5.9 06/29/2015   HGB 14.2 06/29/2015   HCT 42.2 06/29/2015   MCV 87.4 06/29/2015   PLT 240 06/29/2015   Lab Results  Component Value Date   NA 137 06/29/2015   K 3.9 06/29/2015   CL 101 06/29/2015   CO2 25 06/29/2015   BUN 12 06/29/2015   CREATININE 0.78 06/29/2015   GLUCOSE 109* 06/29/2015   No results found for: INR, PROTIME  Assessment/Plan: L4-5 spondylolisthesis, spinal stenosis, lumbago, lumbar radiculopathy, neurogenic claudication: I have discussed the situation with the patient and reviewed his imaging studies with him. We have discussed the various treatment options including surgery. I have described the surgical treatment option of an L4-5 decompression, instrumentation, and fusion. I have shown him surgical models. We have discussed the risks, benefits, alternatives, and likelihood of achieving her goals with surgery. I have answered all the patient's questions. He has decided to proceed with surgery.  Harman Ferrin D 07/06/2015 10:57 AM

## 2015-07-06 NOTE — Anesthesia Postprocedure Evaluation (Signed)
Anesthesia Post Note  Patient: Christopher Perez  Procedure(s) Performed: Procedure(s) (LRB): LUMBAR FOUR-FIVE POSTERIOR LUMBAR INTERBODY FUSION (N/A)  Patient location during evaluation: PACU Anesthesia Type: General Level of consciousness: awake and alert Pain management: pain level controlled Vital Signs Assessment: post-procedure vital signs reviewed and stable Respiratory status: spontaneous breathing, nonlabored ventilation, respiratory function stable and patient connected to nasal cannula oxygen Cardiovascular status: blood pressure returned to baseline and stable Postop Assessment: no signs of nausea or vomiting Anesthetic complications: no    Last Vitals:  Filed Vitals:   07/06/15 1515 07/06/15 1530  BP: 128/68 120/68  Pulse: 61 66  Temp:    Resp: 15 22    Last Pain:  Filed Vitals:   07/06/15 1535  PainSc: 6                  Montez Hageman

## 2015-07-07 LAB — BASIC METABOLIC PANEL
Anion gap: 9 (ref 5–15)
BUN: 6 mg/dL (ref 6–20)
CHLORIDE: 100 mmol/L — AB (ref 101–111)
CO2: 25 mmol/L (ref 22–32)
CREATININE: 0.77 mg/dL (ref 0.61–1.24)
Calcium: 8.6 mg/dL — ABNORMAL LOW (ref 8.9–10.3)
GFR calc Af Amer: >60 mL/min (ref >60)
GFR calc non Af Amer: >60 mL/min (ref >60)
Glucose, Bld: 151 mg/dL — ABNORMAL HIGH (ref 65–99)
POTASSIUM: 3.7 mmol/L (ref 3.5–5.1)
SODIUM: 134 mmol/L — AB (ref 135–145)

## 2015-07-07 LAB — CBC
HEMATOCRIT: 37.3 % — AB (ref 39.0–52.0)
HEMOGLOBIN: 12.1 g/dL — AB (ref 13.0–17.0)
MCH: 28.2 pg (ref 26.0–34.0)
MCHC: 32.4 g/dL (ref 30.0–36.0)
MCV: 86.9 fL (ref 78.0–100.0)
Platelets: 219 10*3/uL (ref 150–400)
RBC: 4.29 MIL/uL (ref 4.22–5.81)
RDW: 13.7 % (ref 11.5–15.5)
WBC: 8.4 10*3/uL (ref 4.0–10.5)

## 2015-07-07 MED ORDER — PANTOPRAZOLE SODIUM 40 MG PO TBEC
40.0000 mg | DELAYED_RELEASE_TABLET | Freq: Two times a day (BID) | ORAL | Status: DC
  Administered 2015-07-07 – 2015-07-08 (×3): 40 mg via ORAL
  Filled 2015-07-07 (×3): qty 1

## 2015-07-07 MED FILL — Heparin Sodium (Porcine) Inj 1000 Unit/ML: INTRAMUSCULAR | Qty: 30 | Status: AC

## 2015-07-07 MED FILL — Sodium Chloride IV Soln 0.9%: INTRAVENOUS | Qty: 1000 | Status: AC

## 2015-07-07 NOTE — Evaluation (Signed)
Physical Therapy Evaluation Patient Details Name: Christopher Perez MRN: [id number] DOB: 1945-10-27 Today's Date: 07/07/2015   History of Present Illness  adm for L4-5 decompression and fusion  PMHx- arthritis, CVA, HTN, CAD  Clinical Impression  Patient is s/p above surgery resulting in the deficits listed below (see PT Problem List).  Patient will benefit from skilled PT to increase their independence and safety with mobility (while adhering to their precautions) to allow discharge to the venue listed below.     Follow Up Recommendations No PT follow up;Supervision for mobility/OOB    Equipment Recommendations  Rolling walker with 5" wheels    Recommendations for Other Services OT consult     Precautions / Restrictions Precautions Precautions: Back Precaution Booklet Issued: Yes (comment) Precaution Comments: educated on precautions Required Braces or Orthoses: Spinal Brace Spinal Brace: Lumbar corset;Applied in sitting position Restrictions Weight Bearing Restrictions: No      Mobility  Bed Mobility Overal bed mobility: Needs Assistance Bed Mobility: Sit to Sidelying         Sit to sidelying: Mod assist General bed mobility comments: incr time, cues, and attempts for sit to sidelying while adhering to back precautions  Transfers Overall transfer level: Needs assistance Equipment used: Rolling walker (2 wheeled);None Transfers: Sit to/from Stand Sit to Stand: Min guard         General transfer comment: x2; utilized RW due to increasing knee pain with noticeable limp  Ambulation/Gait Ambulation/Gait assistance: Min guard Ambulation Distance (Feet): 150 Feet (no device; 20 with RW) Assistive device: Rolling walker (2 wheeled);None Gait Pattern/deviations: Step-through pattern;Decreased stride length;Antalgic   Gait velocity interpretation: Below normal speed for age/gender General Gait Details: began antalgic pattern after ~100 ft (no device); encouraged  use of RW to avoid limping and incr strain on his low back  Stairs Stairs: Yes Stairs assistance: Min guard Stair Management: One rail Right;Alternating pattern;Forwards Number of Stairs: 5    Wheelchair Mobility    Modified Rankin (Stroke Patients Only)       Balance Overall balance assessment: No apparent balance deficits (not formally assessed)                                           Pertinent Vitals/Pain Pain Assessment: 0-10 Pain Score: 5  Pain Location: low back Pain Descriptors / Indicators: Operative site guarding Pain Intervention(s): Limited activity within patient's tolerance;Monitored during session;Premedicated before session;Repositioned    Home Living Family/patient expects to be discharged to:: Private residence Living Arrangements: Spouse/significant other Available Help at Discharge: Family;Available 24 hours/day Type of Home: House Home Access: Stairs to enter Entrance Stairs-Rails: Right Entrance Stairs-Number of Steps: 2 Home Layout: Multi-level;Able to live on main level with bedroom/bathroom Home Equipment: Kasandra Knudsen - single point      Prior Function Level of Independence: Independent               Hand Dominance        Extremity/Trunk Assessment   Upper Extremity Assessment: Overall WFL for tasks assessed           Lower Extremity Assessment: Overall WFL for tasks assessed      Cervical / Trunk Assessment: Normal  Communication   Communication: HOH  Cognition Arousal/Alertness: Awake/alert Behavior During Therapy: WFL for tasks assessed/performed Overall Cognitive Status: Within Functional Limits for tasks assessed  General Comments      Exercises        Assessment/Plan    PT Assessment Patient needs continued PT services  PT Diagnosis Difficulty walking;Acute pain   PT Problem List Decreased activity tolerance;Decreased mobility;Decreased knowledge of use of  DME;Decreased knowledge of precautions;Obesity;Pain  PT Treatment Interventions DME instruction;Gait training;Functional mobility training;Therapeutic activities;Patient/family education   PT Goals (Current goals can be found in the Care Plan section) Acute Rehab PT Goals Patient Stated Goal: go home Fri PT Goal Formulation: With patient Time For Goal Achievement: 07/10/15 Potential to Achieve Goals: Good    Frequency Min 5X/week   Barriers to discharge        Co-evaluation               End of Session Equipment Utilized During Treatment: Gait belt;Back brace Activity Tolerance: Patient tolerated treatment well Patient left: in bed;with call bell/phone within reach;with bed alarm set Nurse Communication: Mobility status         Time: [id number] PT Time Calculation (min) (ACUTE ONLY): 31 min   Charges:   PT Evaluation $PT Eval Moderate Complexity: 1 Procedure PT Treatments $Gait Training: 8-22 mins   PT G Codes:        Jasmyn Picha 2015/07/28, 10:36 AM Pager [phone]

## 2015-07-07 NOTE — Progress Notes (Signed)
Patient ID: Christopher Perez, male   DOB: [birth year], 70 y.o.   MRN: [id number] Subjective:  The patient is alert and pleasant. He is in no apparent distress. He has ambulated yet. He complains of some reflux.  Objective: Vital signs in last 24 hours: Temp:  [97.2 F (36.2 C)-98.3 F (36.8 C)] 98.3 F (36.8 C) (03/30 0524) Pulse Rate:  [50-79] 79 (03/30 0524) Resp:  [14-34] 18 (03/30 0524) BP: (106-152)/(53-82) 143/71 mmHg (03/30 0524) SpO2:  [88 %-100 %] 90 % (03/30 0524) Weight:  [97.977 kg (216 lb)] 97.977 kg (216 lb) (03/29 0926)  Intake/Output from previous day: 03/29 0701 - 03/30 0700 In: 1900 [P.O.:400; I.V.:1500] Out: 565 [Urine:325; Blood:240] Intake/Output this shift:    Physical exam the patient is alert and pleasant. He is moving his lower extremities well. He looks well.  Lab Results:  Recent Labs  07/07/15 0320  WBC 8.4  HGB 12.1*  HCT 37.3*  PLT 219   BMET  Recent Labs  07/07/15 0320  NA 134*  K 3.7  CL 100*  CO2 25  GLUCOSE 151*  BUN 6  CREATININE 0.77  CALCIUM 8.6*    Studies/Results: Dg Lumbar Spine 2-3 Views  07/06/2015  CLINICAL DATA:  L4-5 PLIF EXAM: LUMBAR SPINE - 2-3 VIEW; DG C-ARM 61-120 MIN COMPARISON:  None FINDINGS: Two images obtained via C-arm radiography in the operating room show posterior laminectomy and hardware fixation of the L4-5 vertebra. IMPRESSION: 1. Status post PLIF of L4-5. Electronically Signed   By: Kerby Moors M.D.   On: 07/06/2015 15:17   Dg Lumbar Spine 1 View  07/06/2015  CLINICAL DATA:  Intraoperative localization EXAM: LUMBAR SPINE - 1 VIEW COMPARISON:  05/24/2015 FINDINGS: Retractors and probe centered over the lower L4 spinous process which is at the L4-5 disc level. Spinal numbering is based on preoperative MRI comparison. No acute osseous finding. IMPRESSION: Surgical probe at the L4-5 disc level. Electronically Signed   By: Monte Fantasia M.D.   On: 07/06/2015 14:01   Dg C-arm 1-60 Min  07/06/2015   CLINICAL DATA:  L4-5 PLIF EXAM: LUMBAR SPINE - 2-3 VIEW; DG C-ARM 61-120 MIN COMPARISON:  None FINDINGS: Two images obtained via C-arm radiography in the operating room show posterior laminectomy and hardware fixation of the L4-5 vertebra. IMPRESSION: 1. Status post PLIF of L4-5. Electronically Signed   By: Kerby Moors M.D.   On: 07/06/2015 15:17    Assessment/Plan: Postop day #1: We will mobilize the patient and discontinue his Foley catheter. He may go home tomorrow. I will add Protonix for his reflux. He takes Prilosec at home.  LOS: 1 day     Liany Mumpower D 07/07/2015, 8:09 AM

## 2015-07-07 NOTE — Care Management Note (Signed)
Case Management Note  Patient Details  Name: Christopher Perez MRN: [id number] Date of Birth: [birth year]  Subjective/Objective:                    Action/Plan: Patient was admitted for a PLIF. Lives at home with spouse. Will follow for discharge needs pending PT/OT evals and physician orders.  Expected Discharge Date:                  Expected Discharge Plan:     In-House Referral:     Discharge planning Services     Post Acute Care Choice:    Choice offered to:     DME Arranged:    DME Agency:     HH Arranged:    HH Agency:     Status of Service:  In process, will continue to follow  Medicare Important Message Given:    Date Medicare IM Given:    Medicare IM give by:    Date Additional Medicare IM Given:    Additional Medicare Important Message give by:     If discussed at [place] of Stay Meetings, dates discussed:    Additional CommentsRolm Baptise, RN 07/07/2015, 11:07 AM [phone]

## 2015-07-08 MED ORDER — OXYCODONE-ACETAMINOPHEN 10-325 MG PO TABS: 1.0000 | ORAL_TABLET | ORAL | Status: DC | PRN

## 2015-07-08 MED ORDER — DOCUSATE SODIUM 100 MG PO CAPS: 100.0000 mg | ORAL_CAPSULE | Freq: Two times a day (BID) | ORAL | Status: DC

## 2015-07-08 MED ORDER — CYCLOBENZAPRINE HCL 10 MG PO TABS: 10.0000 mg | ORAL_TABLET | Freq: Three times a day (TID) | ORAL | Status: DC | PRN

## 2015-07-08 NOTE — Progress Notes (Signed)
Patient is being dc home. Dc instructions given and patient verbalized understanding. Patient left with all belongings.

## 2015-07-08 NOTE — Progress Notes (Signed)
Physical Therapy Treatment Patient Details Name: Christopher Perez MRN: [id number] DOB: January 18, 1946 Today's Date: 07-25-2015    History of Present Illness adm for L4-5 decompression and fusion  PMHx- arthritis, CVA, HTN, CAD    PT Comments    Pt mobilizing well today.  Reviewed back precautions, sleeping positions with pillows placement, and maintaining lumbar support with sitting positions.  Pt had no further questions and feels ready for d/c home today.   Follow Up Recommendations  No PT follow up;Supervision for mobility/OOB     Equipment Recommendations  Rolling walker with 5" wheels    Recommendations for Other Services       Precautions / Restrictions Precautions Precautions: Back Precaution Booklet Issued: Yes (comment) Precaution Comments: reviewed back precautions Required Braces or Orthoses: Spinal Brace Spinal Brace: Lumbar corset;Applied in sitting position    Mobility  Bed Mobility               General bed mobility comments: pt up in recliner on arrival  Transfers Overall transfer level: Needs assistance Equipment used: Rolling walker (2 wheeled) Transfers: Sit to/from Stand Sit to Stand: Min guard         General transfer comment: verbal cues for precautions  Ambulation/Gait Ambulation/Gait assistance: Min guard Ambulation Distance (Feet): 180 Feet Assistive device: Rolling walker (2 wheeled) Gait Pattern/deviations: Step-through pattern;Decreased stride length;Antalgic     General Gait Details: encouraged using RW again today due to pain level   Stairs Stairs: Yes Stairs assistance: Min guard Stair Management: One rail Right;Alternating pattern;Forwards Number of Stairs: 5 General stair comments: performed well, without difficulty  Wheelchair Mobility    Modified Rankin (Stroke Patients Only)       Balance                                    Cognition Arousal/Alertness: Awake/alert Behavior During  Therapy: WFL for tasks assessed/performed Overall Cognitive Status: Within Functional Limits for tasks assessed                      Exercises      General Comments        Pertinent Vitals/Pain Pain Assessment: 0-10 Pain Score: 5  Pain Location: low back Pain Descriptors / Indicators: Sore Pain Intervention(s): Limited activity within patient's tolerance;Monitored during session;Repositioned    Home Living                      Prior Function            PT Goals (current goals can now be found in the care plan section) Progress towards PT goals: Progressing toward goals    Frequency  Min 5X/week    PT Plan Current plan remains appropriate    Co-evaluation             End of Session Equipment Utilized During Treatment: Back brace Activity Tolerance: Patient tolerated treatment well Patient left: in chair;with call bell/phone within reach;with family/visitor present     Time: [id number] PT Time Calculation (min) (ACUTE ONLY): 11 min  Charges:  $Gait Training: 8-22 mins                    G Codes:      Sapna Padron,KATHrine E July 25, 2015, 10:07 AM Carmelia Bake, PT, DPT 2015-07-25 Pager: [phone]

## 2015-07-08 NOTE — Discharge Summary (Signed)
Physician Discharge Summary  Patient ID: Christopher Perez MRN: [id number] DOB/AGE: [birth year] 70 y.o.  Admit date: 07/06/2015 Discharge date: 07/08/2015  Admission Diagnoses:L4-5 spinal stenosis, spondylolisthesis, lumbago, lumbar radiculopathy, neurogenic claudication  Discharge Diagnoses: The same Active Problems:   Spondylolisthesis of lumbar region   Discharged Condition: good  Hospital Course: I performed an L4-5 decompression, instrumentation, and fusion on the patient on 07/06/2015. The surgery went well.  The patient's postoperative course was unremarkable. On postoperative day #2 the patient requested discharge to home. The patient was given written and oral discharge instructions. All his questions were answered.  Consults: Physical therapy Significant Diagnostic Studies: None Treatments: L4-5 decompression, instrumentation, and fusion. Discharge Exam: Blood pressure 153/97, pulse 81, temperature 97.5 F (36.4 C), temperature source Oral, resp. rate 16, height 5\' 10"  (1.778 m), weight 97.977 kg (216 lb), SpO2 94 %. The patient is alert and pleasant. He looks well. His strength is normal.  Disposition: Home  Discharge Instructions    Call MD for:  difficulty breathing, headache or visual disturbances    Complete by:  As directed      Call MD for:  extreme fatigue    Complete by:  As directed      Call MD for:  hives    Complete by:  As directed      Call MD for:  persistant dizziness or light-headedness    Complete by:  As directed      Call MD for:  persistant nausea and vomiting    Complete by:  As directed      Call MD for:  redness, tenderness, or signs of infection (pain, swelling, redness, odor or green/yellow discharge around incision site)    Complete by:  As directed      Call MD for:  severe uncontrolled pain    Complete by:  As directed      Call MD for:  temperature >100.4    Complete by:  As directed      Diet - low sodium heart healthy     Complete by:  As directed      Discharge instructions    Complete by:  As directed   Call [phone] for a followup appointment. Take a stool softener while you are using pain medications.     Driving Restrictions    Complete by:  As directed   Do not drive for 2 weeks.     Increase activity slowly    Complete by:  As directed      Lifting restrictions    Complete by:  As directed   Do not lift more than 5 pounds. No excessive bending or twisting.     May shower / Bathe    Complete by:  As directed   He may shower after the pain she is removed 3 days after surgery. Leave the incision alone.     Remove dressing in 48 hours    Complete by:  As directed   Your stitches are under the scan and will dissolve by themselves. The Steri-Strips will fall off after you take a few showers. Do not rub back or pick at the wound, Leave the wound alone.            Medication List    STOP taking these medications        naproxen sodium 220 MG tablet  Commonly known as:  ANAPROX      TAKE these medications        aspirin  81 MG tablet  Take 81 mg by mouth daily.     cyclobenzaprine 10 MG tablet  Commonly known as:  FLEXERIL  Take 1 tablet (10 mg total) by mouth 3 (three) times daily as needed for muscle spasms.     dicyclomine 20 MG tablet  Commonly known as:  BENTYL  Take 20 mg by mouth as needed for spasms (STOMACH).     docusate sodium 100 MG capsule  Commonly known as:  COLACE  Take 1 capsule (100 mg total) by mouth 2 (two) times daily.     metoprolol 50 MG tablet  Commonly known as:  LOPRESSOR  Take 50 mg by mouth daily.     oxyCODONE-acetaminophen 10-325 MG tablet  Commonly known as:  PERCOCET  Take 1 tablet by mouth every 4 (four) hours as needed for pain.     PRALUENT 75 MG/ML Sopn  Generic drug:  Alirocumab  Inject 75 mg into the skin every 14 (fourteen) days. CHOLESTEROL MEDICATION         Signed: Ophelia Charter 07/08/2015, 7:47 AM

## 2015-07-28 DIAGNOSIS — M4316 Spondylolisthesis, lumbar region: Secondary | ICD-10-CM | POA: Diagnosis not present

## 2015-07-28 DIAGNOSIS — Z6830 Body mass index (BMI) 30.0-30.9, adult: Secondary | ICD-10-CM | POA: Diagnosis not present

## 2015-08-22 ENCOUNTER — Telehealth: Payer: Self-pay | Admitting: Cardiovascular Disease

## 2015-08-22 NOTE — Telephone Encounter (Signed)
Pt called to make 1 year fu appt due August-appt made 12-19-15 at 845am, requesting call to discuss med injection and labs

## 2015-08-22 NOTE — Telephone Encounter (Signed)
I spoke with the pt and he is due for yearly follow-up in August but his appt is in September.  I rescheduled appt to August with Dr Burt Knack.  The pt is also taking Praluent and he said he has not had his lab checked since a few weeks after starting the medication.  I advised the pt that I will place orders for him to have his cholesterol and liver function checked.  The pt would like to have lab work on 08/24/15.

## 2015-08-24 ENCOUNTER — Other Ambulatory Visit (INDEPENDENT_AMBULATORY_CARE_PROVIDER_SITE_OTHER): Payer: Medicare Other | Admitting: *Deleted

## 2015-08-24 DIAGNOSIS — I10 Essential (primary) hypertension: Secondary | ICD-10-CM

## 2015-08-24 LAB — HEPATIC FUNCTION PANEL
ALT: 14 U/L (ref 9–46)
AST: 17 U/L (ref 10–35)
Albumin: 3.9 g/dL (ref 3.6–5.1)
Alkaline Phosphatase: 60 U/L (ref 40–115)
Bilirubin, Direct: <0.1 mg/dL (ref <=0.2)
TOTAL PROTEIN: 7.1 g/dL (ref 6.1–8.1)
Total Bilirubin: 0.5 mg/dL (ref 0.2–1.2)

## 2015-08-24 LAB — CHOLESTEROL, TOTAL: Cholesterol: 177 mg/dL (ref 125–200)

## 2015-10-18 DIAGNOSIS — M4316 Spondylolisthesis, lumbar region: Secondary | ICD-10-CM | POA: Diagnosis not present

## 2015-10-18 DIAGNOSIS — Z6831 Body mass index (BMI) 31.0-31.9, adult: Secondary | ICD-10-CM | POA: Diagnosis not present

## 2015-10-18 DIAGNOSIS — M545 Low back pain: Secondary | ICD-10-CM | POA: Diagnosis not present

## 2015-10-18 DIAGNOSIS — I10 Essential (primary) hypertension: Secondary | ICD-10-CM | POA: Diagnosis not present

## 2015-10-20 DIAGNOSIS — E782 Mixed hyperlipidemia: Secondary | ICD-10-CM | POA: Diagnosis not present

## 2015-10-20 DIAGNOSIS — Z0000 Encounter for general adult medical examination without abnormal findings: Secondary | ICD-10-CM | POA: Diagnosis not present

## 2015-10-20 DIAGNOSIS — Z23 Encounter for immunization: Secondary | ICD-10-CM | POA: Diagnosis not present

## 2015-10-20 DIAGNOSIS — R300 Dysuria: Secondary | ICD-10-CM | POA: Diagnosis not present

## 2015-10-20 DIAGNOSIS — Z79899 Other long term (current) drug therapy: Secondary | ICD-10-CM | POA: Diagnosis not present

## 2015-10-20 DIAGNOSIS — Z125 Encounter for screening for malignant neoplasm of prostate: Secondary | ICD-10-CM | POA: Diagnosis not present

## 2015-10-25 ENCOUNTER — Encounter: Payer: Self-pay | Admitting: *Deleted

## 2015-11-11 ENCOUNTER — Ambulatory Visit (INDEPENDENT_AMBULATORY_CARE_PROVIDER_SITE_OTHER): Payer: Medicare Other | Admitting: Cardiovascular Disease

## 2015-11-11 ENCOUNTER — Encounter (INDEPENDENT_AMBULATORY_CARE_PROVIDER_SITE_OTHER): Payer: Self-pay

## 2015-11-11 ENCOUNTER — Encounter: Payer: Self-pay | Admitting: Cardiovascular Disease

## 2015-11-11 VITALS — BP 132/90 | HR 48 | Ht 70.0 in | Wt 217.8 lb

## 2015-11-11 DIAGNOSIS — I2510 Atherosclerotic heart disease of native coronary artery without angina pectoris: Secondary | ICD-10-CM | POA: Diagnosis not present

## 2015-11-11 DIAGNOSIS — E785 Hyperlipidemia, unspecified: Secondary | ICD-10-CM | POA: Diagnosis not present

## 2015-11-11 LAB — LIPID PANEL
Cholesterol: 184 mg/dL (ref 125–200)
HDL: 60 mg/dL
LDL Cholesterol: 88 mg/dL
Total CHOL/HDL Ratio: 3.1 ratio
Triglycerides: 179 mg/dL — ABNORMAL HIGH
VLDL: 36 mg/dL — ABNORMAL HIGH

## 2015-11-11 LAB — HEPATIC FUNCTION PANEL
ALT: 20 U/L (ref 9–46)
AST: 24 U/L (ref 10–35)
Albumin: 4.3 g/dL (ref 3.6–5.1)
Alkaline Phosphatase: 54 U/L (ref 40–115)
Bilirubin, Direct: 0.1 mg/dL
Indirect Bilirubin: 0.4 mg/dL (ref 0.2–1.2)
Total Bilirubin: 0.5 mg/dL (ref 0.2–1.2)
Total Protein: 7.4 g/dL (ref 6.1–8.1)

## 2015-11-11 NOTE — Progress Notes (Signed)
Cardiology Office Note Date:  11/11/2015   ID:  Christopher Perez 1945/08/03, MRN [id number]  PCP:  Ann Held, MD  Cardiologist:  Sherren Mocha, MD    Chief Complaint  Patient presents with  . Follow-up    CAD   History of Present Illness: Christopher Perez is a 70 y.o. male who presents for Follow-up of coronary artery disease. The patient initially presented in 2007 with exertional angina. He had a high risk nuclear scan demonstrating inferolateral ischemia and he was found to have critical stenosis of a large OM branch of the circumflex. He underwent PCI with a drug-eluting stent and has had no recurrent problems.  The patient has been statin intolerant to multiple agents. He is intolerant to zetia. He has been started on Praluent and is tolerating it well. His total cholesterol has dropped from 315 down to about 175. His LDL has dropped from 218 down to 86. He is due for lab work today.  From a symptomatic perspective, he is doing very well. He had back surgery in April and has recovered from this. Today, he denies symptoms of palpitations, chest pain, shortness of breath, orthopnea, PND, lower extremity edema, dizziness, or syncope.   Past Medical History:  Diagnosis Date  . Arthritis   . CAD (coronary artery disease)   . GERD (gastroesophageal reflux disease)    OCC  . HLD (hyperlipidemia)   . HTN (hypertension)   . Neuromuscular disorder ([place])    NUMB/ TINGLING TOES  . Stroke [facility])    ? TIA    5-6 YRS AGO    Past Surgical History:  Procedure Laterality Date  . CARDIAC CATHETERIZATION    . CORONARY ANGIOPLASTY     STENT PLACED  03/2006  . FOOT SURGERY     RT FOOT  . KNEE ARTHROSCOPY     RT KNEE ,  MENISCUS REP    Current Outpatient Prescriptions  Medication Sig Dispense Refill  . Alirocumab (PRALUENT) 75 MG/ML SOPN Inject 75 mg into the skin every 14 (fourteen) days. CHOLESTEROL MEDICATION    . aspirin 81 MG tablet Take 81 mg by mouth daily.       . cyclobenzaprine (FLEXERIL) 10 MG tablet Take 1 tablet (10 mg total) by mouth 3 (three) times daily as needed for muscle spasms. 50 tablet 1  . dicyclomine (BENTYL) 20 MG tablet Take 20 mg by mouth as needed for spasms (STOMACH).     Marland Kitchen docusate sodium (COLACE) 100 MG capsule Take 1 capsule (100 mg total) by mouth 2 (two) times daily. 60 capsule 0  . metoprolol (LOPRESSOR) 50 MG tablet Take 50 mg by mouth daily.    Marland Kitchen oxyCODONE-acetaminophen (PERCOCET) 10-325 MG tablet Take 1 tablet by mouth every 4 (four) hours as needed for pain. 100 tablet 0   No current facility-administered medications for this visit.     Allergies:   Statins   Social History:  The patient  reports that he quit smoking about 32 years ago. He does not have any smokeless tobacco history on file. He reports that he drinks about 7.0 oz of alcohol per week . He reports that he does not use drugs.   Family History:  The patient's  family history is not on file.    ROS:  Please see the history of present illness.  Otherwise, review of systems is positive for back pain.  All other systems are reviewed and negative.    PHYSICAL EXAM: VS:  BP  132/90 (BP Location: Left Arm, Patient Position: Sitting, Cuff Size: Normal)   Pulse (!) 48   Ht 5\' 10"  (1.778 m)   Wt 217 lb 12.8 oz (98.8 kg)   BMI 31.25 kg/m  , BMI Body mass index is 31.25 kg/m. GEN: Well nourished, well developed, in no acute distress  HEENT: normal  Neck: no JVD, no masses. No carotid bruits Cardiac: RRR without murmur or gallop                Respiratory:  clear to auscultation bilaterally, normal work of breathing GI: soft, nontender, nondistended, + BS MS: no deformity or atrophy  Ext: no pretibial edema, pedal pulses 2+= bilaterally Skin: warm and dry, no rash Neuro:  Strength and sensation are intact Psych: euthymic mood, full affect  EKG:  EKG is ordered today. The ekg ordered today shows sinus bradycardia 48 bpm, otherwise within normal  limits  Recent Labs: 07/07/2015: BUN 6; Creatinine, Ser 0.77; Hemoglobin 12.1; Platelets 219; Potassium 3.7; Sodium 134 08/24/2015: ALT 14   Lipid Panel     Component Value Date/Time   CHOL 177 08/24/2015 0807   TRIG 192 (H) 01/18/2015 0804   HDL 51 01/18/2015 0804   CHOLHDL 3.4 01/18/2015 0804   VLDL 38 (H) 01/18/2015 0804   LDLCALC 86 01/18/2015 0804   LDLDIRECT 188.0 11/09/2014 0734      Wt Readings from Last 3 Encounters:  11/11/15 217 lb 12.8 oz (98.8 kg)  07/06/15 216 lb (98 kg)  06/29/15 216 lb 11.2 oz (98.3 kg)     ASSESSMENT AND PLAN: 1.  Coronary artery disease, native vessel, without angina: The patient will continue on his current medical program. Medications are reviewed and no changes recommended today.  2. Hyperlipidemia: The patient is doing well on a PCSK9 inhibitor. Follow-up labs today. Diet/lifestyle modification reviewed.   3. HTN: Home blood pressure range 125-135/75-80. Continue metoprolol.  4. Sinus bradycardia: The patient is asymptomatic. Okay to continue metoprolol.  Current medicines are reviewed with the patient today.  The patient does not have concerns regarding medicines.  Labs/ tests ordered today include:   Orders Placed This Encounter  Procedures  . Lipid panel  . Hepatic function panel  . EKG 12-Lead    Disposition:   FU one year  Signed, Sherren Mocha, MD  11/11/2015 10:41 AM    [facility] [address], [place], [place]  [address] Phone: [phone]; Fax: [phone]

## 2015-11-11 NOTE — Patient Instructions (Signed)
Medication Instructions:  Your physician recommends that you continue on your current medications as directed. Please refer to the Current Medication list given to you today.  Labwork: Your physician recommends that you have lab work today: Lipid and Liver  Testing/Procedures: No new orders.   Follow-Up: Your physician wants you to follow-up in: 1 YEAR with Dr Burt Knack.  You will receive a reminder letter in the mail two months in advance. If you don't receive a letter, please call our office to schedule the follow-up appointment.   Any Other Special Instructions Will Be Listed Below (If Applicable).     If you need a refill on your cardiac medications before your next appointment, please call your pharmacy.

## 2015-11-30 ENCOUNTER — Telehealth: Payer: Self-pay | Admitting: *Deleted

## 2015-11-30 NOTE — Telephone Encounter (Signed)
Pt has been notified of lab results by phone with verbal understanding. 

## 2015-12-02 ENCOUNTER — Other Ambulatory Visit: Payer: Self-pay

## 2015-12-19 ENCOUNTER — Ambulatory Visit: Payer: Medicare Other | Admitting: Cardiovascular Disease

## 2015-12-22 DIAGNOSIS — Z23 Encounter for immunization: Secondary | ICD-10-CM | POA: Diagnosis not present

## 2016-01-27 DIAGNOSIS — S61213A Laceration without foreign body of left middle finger without damage to nail, initial encounter: Secondary | ICD-10-CM | POA: Diagnosis not present

## 2016-03-08 ENCOUNTER — Other Ambulatory Visit: Payer: Self-pay | Admitting: Pharmacist

## 2016-03-08 MED ORDER — ALIROCUMAB 75 MG/ML ~~LOC~~ SOPN: 75.0000 mg | PEN_INJECTOR | SUBCUTANEOUS | 11 refills | Status: DC

## 2016-03-08 NOTE — Progress Notes (Signed)
Pt called and is still trying to shop around for cheaper Praluent copays. Asked that I send rx to [facility] for 2018 quote and to local [facility]. Pt will call for copay information to see if either is more affordable.

## 2016-04-17 DIAGNOSIS — M545 Low back pain: Secondary | ICD-10-CM | POA: Diagnosis not present

## 2016-04-17 DIAGNOSIS — Z6831 Body mass index (BMI) 31.0-31.9, adult: Secondary | ICD-10-CM | POA: Diagnosis not present

## 2016-04-17 DIAGNOSIS — I10 Essential (primary) hypertension: Secondary | ICD-10-CM | POA: Diagnosis not present

## 2016-04-23 DIAGNOSIS — M6281 Muscle weakness (generalized): Secondary | ICD-10-CM | POA: Diagnosis not present

## 2016-04-23 DIAGNOSIS — M545 Low back pain: Secondary | ICD-10-CM | POA: Diagnosis not present

## 2016-04-27 DIAGNOSIS — M545 Low back pain: Secondary | ICD-10-CM | POA: Diagnosis not present

## 2016-04-27 DIAGNOSIS — M6281 Muscle weakness (generalized): Secondary | ICD-10-CM | POA: Diagnosis not present

## 2016-04-30 DIAGNOSIS — M545 Low back pain: Secondary | ICD-10-CM | POA: Diagnosis not present

## 2016-04-30 DIAGNOSIS — M6281 Muscle weakness (generalized): Secondary | ICD-10-CM | POA: Diagnosis not present

## 2016-05-03 DIAGNOSIS — M545 Low back pain: Secondary | ICD-10-CM | POA: Diagnosis not present

## 2016-05-03 DIAGNOSIS — M6281 Muscle weakness (generalized): Secondary | ICD-10-CM | POA: Diagnosis not present

## 2016-05-07 DIAGNOSIS — M545 Low back pain: Secondary | ICD-10-CM | POA: Diagnosis not present

## 2016-05-07 DIAGNOSIS — M6281 Muscle weakness (generalized): Secondary | ICD-10-CM | POA: Diagnosis not present

## 2016-05-10 DIAGNOSIS — M6281 Muscle weakness (generalized): Secondary | ICD-10-CM | POA: Diagnosis not present

## 2016-05-10 DIAGNOSIS — M545 Low back pain: Secondary | ICD-10-CM | POA: Diagnosis not present

## 2016-05-14 DIAGNOSIS — M6281 Muscle weakness (generalized): Secondary | ICD-10-CM | POA: Diagnosis not present

## 2016-05-14 DIAGNOSIS — M545 Low back pain: Secondary | ICD-10-CM | POA: Diagnosis not present

## 2016-05-16 DIAGNOSIS — M545 Low back pain: Secondary | ICD-10-CM | POA: Diagnosis not present

## 2016-05-16 DIAGNOSIS — M6281 Muscle weakness (generalized): Secondary | ICD-10-CM | POA: Diagnosis not present

## 2016-05-21 DIAGNOSIS — M545 Low back pain: Secondary | ICD-10-CM | POA: Diagnosis not present

## 2016-05-21 DIAGNOSIS — M6281 Muscle weakness (generalized): Secondary | ICD-10-CM | POA: Diagnosis not present

## 2016-05-24 ENCOUNTER — Telehealth: Payer: Self-pay | Admitting: Pharmacist

## 2016-05-24 DIAGNOSIS — M545 Low back pain: Secondary | ICD-10-CM | POA: Diagnosis not present

## 2016-05-24 DIAGNOSIS — M6281 Muscle weakness (generalized): Secondary | ICD-10-CM | POA: Diagnosis not present

## 2016-05-24 MED ORDER — ALIROCUMAB 75 MG/ML ~~LOC~~ SOPN: 75.0000 mg | PEN_INJECTOR | SUBCUTANEOUS | 11 refills | Status: DC

## 2016-05-24 NOTE — Telephone Encounter (Signed)
Spoke with pt regarding Praluent. New patient assistance program for Medicare patients opened yesterday. We had been providing pt with samples before that. In order to qualify for assistance, pt needs to have spent $300 out of pocket on prescription medications so far this year. He has not done so since he only pays for metoprolol. Sent in an rx to [facility] - he will pick up 1 pen (2 costs $591 at [facility]) and will get 1 month of metoprolol from [facility] too. This should bring him to $300 out of pocket costs. He will ask for a print out of this at the pharmacy and will call clinic when he has done this so that we can fill out financial assistance paperwork for Praluent.

## 2016-05-28 DIAGNOSIS — M6281 Muscle weakness (generalized): Secondary | ICD-10-CM | POA: Diagnosis not present

## 2016-05-28 DIAGNOSIS — M545 Low back pain: Secondary | ICD-10-CM | POA: Diagnosis not present

## 2016-05-31 DIAGNOSIS — M545 Low back pain: Secondary | ICD-10-CM | POA: Diagnosis not present

## 2016-05-31 DIAGNOSIS — M6281 Muscle weakness (generalized): Secondary | ICD-10-CM | POA: Diagnosis not present

## 2016-06-12 NOTE — Telephone Encounter (Signed)
Pt brought in paperwork for [employer] Praluent assistance program. Faxed info today for copay assistance. Pt will call clinic in ~2 weeks if he has not heard an update from P[employer] at that time.

## 2016-07-30 NOTE — Telephone Encounter (Signed)
Pt was approved for Praluent assistance through PASS program and received his first shipment of medication on 07/13/16.

## 2016-08-07 DIAGNOSIS — M545 Low back pain: Secondary | ICD-10-CM | POA: Diagnosis not present

## 2016-08-07 DIAGNOSIS — M4316 Spondylolisthesis, lumbar region: Secondary | ICD-10-CM | POA: Diagnosis not present

## 2016-08-07 DIAGNOSIS — I10 Essential (primary) hypertension: Secondary | ICD-10-CM | POA: Diagnosis not present

## 2016-08-24 DIAGNOSIS — W57XXXA Bitten or stung by nonvenomous insect and other nonvenomous arthropods, initial encounter: Secondary | ICD-10-CM | POA: Diagnosis not present

## 2016-08-24 DIAGNOSIS — R21 Rash and other nonspecific skin eruption: Secondary | ICD-10-CM | POA: Diagnosis not present

## 2016-08-24 DIAGNOSIS — Z6831 Body mass index (BMI) 31.0-31.9, adult: Secondary | ICD-10-CM | POA: Diagnosis not present

## 2017-01-09 ENCOUNTER — Ambulatory Visit (INDEPENDENT_AMBULATORY_CARE_PROVIDER_SITE_OTHER): Payer: Medicare Other | Admitting: Cardiovascular Disease

## 2017-01-09 ENCOUNTER — Encounter: Payer: Self-pay | Admitting: Cardiovascular Disease

## 2017-01-09 VITALS — BP 140/80 | HR 48 | Ht 70.0 in | Wt 212.8 lb

## 2017-01-09 DIAGNOSIS — I2510 Atherosclerotic heart disease of native coronary artery without angina pectoris: Secondary | ICD-10-CM | POA: Diagnosis not present

## 2017-01-09 DIAGNOSIS — E785 Hyperlipidemia, unspecified: Secondary | ICD-10-CM

## 2017-01-09 LAB — COMPREHENSIVE METABOLIC PANEL
A/G RATIO: 1.4 (ref 1.2–2.2)
ALBUMIN: 4.2 g/dL (ref 3.5–4.8)
ALT: 18 IU/L (ref 0–44)
AST: 22 IU/L (ref 0–40)
Alkaline Phosphatase: 52 IU/L (ref 39–117)
BUN / CREAT RATIO: 18 (ref 10–24)
BUN: 12 mg/dL (ref 8–27)
Bilirubin Total: 0.4 mg/dL (ref 0.0–1.2)
CALCIUM: 9.1 mg/dL (ref 8.6–10.2)
CO2: 21 mmol/L (ref 20–29)
CREATININE: 0.68 mg/dL — AB (ref 0.76–1.27)
Chloride: 103 mmol/L (ref 96–106)
GFR, EST AFRICAN AMERICAN: 112 mL/min/{1.73_m2} (ref >59)
GFR, EST NON AFRICAN AMERICAN: 97 mL/min/{1.73_m2} (ref >59)
GLOBULIN, TOTAL: 3 g/dL (ref 1.5–4.5)
Glucose: 103 mg/dL — ABNORMAL HIGH (ref 65–99)
POTASSIUM: 4.3 mmol/L (ref 3.5–5.2)
SODIUM: 138 mmol/L (ref 134–144)
TOTAL PROTEIN: 7.2 g/dL (ref 6.0–8.5)

## 2017-01-09 LAB — LIPID PANEL
CHOL/HDL RATIO: 3.1 ratio (ref 0.0–5.0)
Cholesterol, Total: 164 mg/dL (ref 100–199)
HDL: 53 mg/dL (ref >39)
LDL CALC: 78 mg/dL (ref 0–99)
TRIGLYCERIDES: 165 mg/dL — AB (ref 0–149)
VLDL Cholesterol Cal: 33 mg/dL (ref 5–40)

## 2017-01-09 NOTE — Progress Notes (Signed)
Cardiology Office Note Date:  01/09/2017   ID:  Christopher Perez, Christopher Perez September 28, 1945, MRN [id number]  PCP:  Myer Peer, MD  Cardiologist:  Sherren Mocha, MD    Chief Complaint  Patient presents with  . Follow-up    hyperlipidemia     History of Present Illness: [name] is a 71 y.o. male who presents for follow-up of coronary artery disease. The patient initially presented in 2007 with exertional angina. He had a high risk nuclear scan demonstrating inferolateral ischemia and he was found to have critical stenosis of a large OM branch of the circumflex. He underwent PCI with a drug-eluting stent and has had no recurrent problems.  He is here alone today. He's been active, using a chain saw to take down trees. No exertional symptoms. Today, he denies symptoms of palpitations, chest pain, shortness of breath, orthopnea, PND, lower extremity edema, dizziness, or syncope.  Past Medical History:  Diagnosis Date  . Arthritis   . CAD (coronary artery disease)   . GERD (gastroesophageal reflux disease)    OCC  . HLD (hyperlipidemia)   . HTN (hypertension)   . Neuromuscular disorder ([place])    NUMB/ TINGLING TOES  . Stroke [facility])    ? TIA    5-6 YRS AGO    Past Surgical History:  Procedure Laterality Date  . CARDIAC CATHETERIZATION    . CORONARY ANGIOPLASTY     STENT PLACED  03/2006  . FOOT SURGERY     RT FOOT  . KNEE ARTHROSCOPY     RT KNEE ,  MENISCUS REP    Current Outpatient Prescriptions  Medication Sig Dispense Refill  . Alirocumab (PRALUENT) 75 MG/ML SOPN Inject 75 mg into the skin every 14 (fourteen) days. CHOLESTEROL MEDICATION 2 pen 11  . aspirin 81 MG tablet Take 81 mg by mouth daily.     . cyclobenzaprine (FLEXERIL) 10 MG tablet Take 1 tablet (10 mg total) by mouth 3 (three) times daily as needed for muscle spasms. 50 tablet 1  . dicyclomine (BENTYL) 20 MG tablet Take 20 mg by mouth every 4 (four) hours as needed for spasms (STOMACH).     Marland Kitchen  metoprolol (LOPRESSOR) 50 MG tablet Take 50 mg by mouth daily.     No current facility-administered medications for this visit.     Allergies:   Statins   Social History:  The patient  reports that he quit smoking about 33 years ago. He has quit using smokeless tobacco. He reports that he drinks about 7.0 oz of alcohol per week . He reports that he does not use drugs.   Family History:  The patient's family history is not on file.  ROS:  Please see the history of present illness.  Otherwise, review of systems is positive for leg pain, joint swelling, muscle pain.  All other systems are reviewed and negative.   PHYSICAL EXAM: VS:  BP 140/80   Pulse (!) 48   Ht 5' 10"  (1.778 m)   Wt 96.5 kg (212 lb 12.8 oz)   BMI 30.53 kg/m  , BMI Body mass index is 30.53 kg/m. GEN: Well nourished, well developed, in no acute distress  HEENT: normal  Neck: no JVD, no masses. No carotid bruits Cardiac: bradycardic and regular without murmur or gallop                Respiratory:  clear to auscultation bilaterally, normal work of breathing GI: soft, nontender, nondistended, + BS MS: no  deformity or atrophy  Ext: no pretibial edema, pedal pulses 2+= bilaterally Skin: warm and dry, no rash Neuro:  Strength and sensation are intact Psych: euthymic mood, full affect  EKG:  EKG is ordered today. The ekg ordered today shows sinus bradycardia 45 bpm, otherwise within normal limits  Recent Labs: No results found for requested labs within last 8760 hours.   Lipid Panel     Component Value Date/Time   CHOL 184 11/11/2015 1050   TRIG 179 (H) 11/11/2015 1050   HDL 60 11/11/2015 1050   CHOLHDL 3.1 11/11/2015 1050   VLDL 36 (H) 11/11/2015 1050   LDLCALC 88 11/11/2015 1050   LDLDIRECT 188.0 11/09/2014 0734      Wt Readings from Last 3 Encounters:  01/09/17 96.5 kg (212 lb 12.8 oz)  11/11/15 98.8 kg (217 lb 12.8 oz)  07/06/15 98 kg (216 lb)    ASSESSMENT AND PLAN: 1.  CAD, native vessel, without  angina: the patient is doing well. No complaints.   2. Hyperlipidemia: treated with Praluent (statin and ezetimibe intolerant). He still has muscle aches in his legs but is able to tolerate this. Update lipids and LFT's today.  3. HTN: BP controlled on current Rx.   4. Bradycardia: asymptomatic. Continue current Rx.   Current medicines are reviewed with the patient today.  The patient does not have concerns regarding medicines.  Labs/ tests ordered today include:   Orders Placed This Encounter  Procedures  . Comp Met (CMET)  . Lipid panel  . EKG 12-Lead   Disposition:   FU one year  Signed, Sherren Mocha, MD  01/09/2017 1:46 PM    [facility] [place], [place], [place]  [address] Phone: [phone]; Fax: [phone]

## 2017-01-09 NOTE — Patient Instructions (Signed)
Medication Instructions:  Your provider recommends that you continue on your current medications as directed. Please refer to the Current Medication list given to you today.    Labwork: TODAY: CMET, Lipids  Testing/Procedures: None  Follow-Up: Your provider wants you to follow-up in: 1 year with Dr. Burt Knack. You will receive a reminder letter in the mail two months in advance. If you don't receive a letter, please call our office to schedule the follow-up appointment.    Any Other Special Instructions Will Be Listed Below (If Applicable).     If you need a refill on your cardiac medications before your next appointment, please call your pharmacy.

## 2017-01-23 DIAGNOSIS — Z23 Encounter for immunization: Secondary | ICD-10-CM | POA: Diagnosis not present

## 2017-02-12 DIAGNOSIS — M545 Low back pain: Secondary | ICD-10-CM | POA: Diagnosis not present

## 2017-03-20 ENCOUNTER — Telehealth: Payer: Self-pay | Admitting: Pharmacist

## 2017-03-20 NOTE — Telephone Encounter (Signed)
Spoke with pt regarding Praluent and reapplying for financial assistance through the PASS program. Pt states he has been experiencing myalgias similar to when he took statin therapy. He is previously intolerant to Zetia, Vytorin, Livalo 2mg  daily, pravastatin 40mg  daily (2008), atorvastatin unknown dose (10-15 years ago), Crestor 10mg  daily (2008), and Crestor 5mg  TIW (03/2013). Discussed remaining on PCSK9i therapy vs pursuing clinical trial as these are patient's main two options given multiple previous intolerances. He would like to stop his Praluent injections since they are negatively affecting his quality of life. He is interested in pursuing the CLEAR trial studying bempedoic acid.  Will route note to research nurses - pt has hx of CAD s/p PCI in 2007 and baseline LDL is near 200, although most recently 78 on Praluent therapy. He will need a 1 month washout prior to rechecking lipid panel to ensure LDL is 100 or greater for enrollment in trial.  Will also route to Dr Burt Knack as an Juluis Rainier with updated plan for lipid management.

## 2017-03-22 ENCOUNTER — Telehealth: Payer: Self-pay | Admitting: *Deleted

## 2017-03-22 NOTE — Telephone Encounter (Signed)
Spoke with Mr. Christopher Perez re: the [employer]. Questions were encouraged and answered.  Patient requested ICF be emailed to him for review.  Emailed consent and will call patient back in 7 days to answer any additional questions and get his decision about participation in the trial.

## 2017-03-28 DIAGNOSIS — L570 Actinic keratosis: Secondary | ICD-10-CM | POA: Diagnosis not present

## 2017-03-28 DIAGNOSIS — L821 Other seborrheic keratosis: Secondary | ICD-10-CM | POA: Diagnosis not present

## 2017-03-29 ENCOUNTER — Telehealth: Payer: Self-pay | Admitting: *Deleted

## 2017-03-29 NOTE — Telephone Encounter (Signed)
Spoke with Mr. Christopher Perez re: the Clear Research study. Scheduled screening visit on 05/01/2017 @0800 .

## 2017-04-10 DIAGNOSIS — J069 Acute upper respiratory infection, unspecified: Secondary | ICD-10-CM | POA: Diagnosis not present

## 2017-05-01 ENCOUNTER — Encounter: Payer: Self-pay | Admitting: *Deleted

## 2017-05-01 DIAGNOSIS — Z006 Encounter for examination for normal comparison and control in clinical research program: Secondary | ICD-10-CM

## 2017-05-01 NOTE — Progress Notes (Signed)
   CLEAR Informed Consent  Subject Name:Christopher Perez  Subject met inclusion and exclusion criteria. The informed consent form, study requirements and expectations were reviewed with the subject and questions and concerns were addressed prior to the signing of the consent form. The subject verbalized understanding of the trail requirements. The subject agreed to participate in the CLEAR trial and signed the informed consent. The informed consent was obtained prior to performance of any protocol-specific procedures for the subject. A copy of the signed informed consent was given to the subject and a copy was placed in the subject's medical record.  [name] 05/01/2017 0800

## 2017-05-13 ENCOUNTER — Encounter: Payer: Self-pay | Admitting: *Deleted

## 2017-05-13 ENCOUNTER — Other Ambulatory Visit: Payer: Self-pay | Admitting: *Deleted

## 2017-05-13 DIAGNOSIS — Z006 Encounter for examination for normal comparison and control in clinical research program: Secondary | ICD-10-CM

## 2017-05-13 MED ORDER — AMBULATORY NON FORMULARY MEDICATION: 180.0000 mg | Freq: Every day | Status: DC

## 2017-05-13 NOTE — Progress Notes (Signed)
Subject to Research clinic for visit S2-W4 in the Clear research study.  No c/o, aes or saes to report.  Subject started in the run-in phase of the study and next appointment scheduled for June 10, 2017 @0800 .

## 2017-06-10 ENCOUNTER — Encounter: Payer: Self-pay | Admitting: *Deleted

## 2017-06-10 DIAGNOSIS — Z006 Encounter for examination for normal comparison and control in clinical research program: Secondary | ICD-10-CM

## 2017-06-10 NOTE — Progress Notes (Signed)
Subject to research clinic for visit T1-W0 in the Clear Research study.  No c/o, aes or saes to report.  100%compliant with run in medications, new drug dispensed.  Next appointment scheduled.

## 2017-07-11 ENCOUNTER — Encounter: Payer: Self-pay | Admitting: *Deleted

## 2017-07-11 DIAGNOSIS — Z006 Encounter for examination for normal comparison and control in clinical research program: Secondary | ICD-10-CM

## 2017-07-11 NOTE — Progress Notes (Signed)
Subject to research clinic for visit T2-M1 in the Clear research study.  No c/o, aes or saes to report.  100% compliant with medications and drug re-dispensed.  Next appointment scheduled.

## 2017-08-13 DIAGNOSIS — M4316 Spondylolisthesis, lumbar region: Secondary | ICD-10-CM | POA: Diagnosis not present

## 2017-08-13 DIAGNOSIS — M545 Low back pain: Secondary | ICD-10-CM | POA: Diagnosis not present

## 2017-08-13 DIAGNOSIS — I10 Essential (primary) hypertension: Secondary | ICD-10-CM | POA: Diagnosis not present

## 2017-08-13 DIAGNOSIS — Z6830 Body mass index (BMI) 30.0-30.9, adult: Secondary | ICD-10-CM | POA: Diagnosis not present

## 2017-09-09 ENCOUNTER — Encounter: Payer: Medicare Other | Admitting: *Deleted

## 2017-09-09 VITALS — BP 133/77 | HR 61 | Wt 209.4 lb

## 2017-09-09 DIAGNOSIS — Z006 Encounter for examination for normal comparison and control in clinical research program: Secondary | ICD-10-CM

## 2017-09-09 NOTE — Progress Notes (Signed)
Subject to the research clinic for T3-M3 in the Clear Research study. No c/os, aes or saes to capture.  93% compliant with   medication and new drug dispensed.  Next follow up appointment scheduled.

## 2017-09-12 DIAGNOSIS — Z79899 Other long term (current) drug therapy: Secondary | ICD-10-CM | POA: Diagnosis not present

## 2017-09-12 DIAGNOSIS — Z7689 Persons encountering health services in other specified circumstances: Secondary | ICD-10-CM | POA: Diagnosis not present

## 2017-09-12 DIAGNOSIS — Z125 Encounter for screening for malignant neoplasm of prostate: Secondary | ICD-10-CM | POA: Diagnosis not present

## 2017-09-12 DIAGNOSIS — I10 Essential (primary) hypertension: Secondary | ICD-10-CM | POA: Diagnosis not present

## 2017-09-12 DIAGNOSIS — E782 Mixed hyperlipidemia: Secondary | ICD-10-CM | POA: Diagnosis not present

## 2017-10-15 DIAGNOSIS — W57XXXA Bitten or stung by nonvenomous insect and other nonvenomous arthropods, initial encounter: Secondary | ICD-10-CM | POA: Diagnosis not present

## 2017-10-15 DIAGNOSIS — Z6829 Body mass index (BMI) 29.0-29.9, adult: Secondary | ICD-10-CM | POA: Diagnosis not present

## 2017-10-15 DIAGNOSIS — S30861A Insect bite (nonvenomous) of abdominal wall, initial encounter: Secondary | ICD-10-CM | POA: Diagnosis not present

## 2017-12-16 VITALS — BP 126/72 | HR 48 | Wt 208.4 lb

## 2017-12-16 DIAGNOSIS — Z006 Encounter for examination for normal comparison and control in clinical research program: Secondary | ICD-10-CM

## 2017-12-16 NOTE — Progress Notes (Signed)
Christopher Perez to research clinic for visit T4M6 in the Clear study.  No complaints, adverse events, or serious adverse events to report.  Subject 93% compliant with medications and new medication dispensed.  Next appointment scheduled.

## 2018-01-06 DIAGNOSIS — H524 Presbyopia: Secondary | ICD-10-CM | POA: Diagnosis not present

## 2018-01-06 DIAGNOSIS — H5203 Hypermetropia, bilateral: Secondary | ICD-10-CM | POA: Diagnosis not present

## 2018-01-06 DIAGNOSIS — H2513 Age-related nuclear cataract, bilateral: Secondary | ICD-10-CM | POA: Diagnosis not present

## 2018-01-17 ENCOUNTER — Telehealth: Payer: Self-pay | Admitting: *Deleted

## 2018-01-17 NOTE — Telephone Encounter (Signed)
Patient called to report he seems to be having an exacerbation of arthritis type pains.  Stated he has not taken any study medications in about a week, was at the beach on vacation.  He has an upcoming appointment with his PCP to discuss options for joint and muscle pain.  Will remain on drug holiday which started 01/10/2018 until after he sees his PCP and will call me back with results from the visit.

## 2018-01-23 DIAGNOSIS — Z0000 Encounter for general adult medical examination without abnormal findings: Secondary | ICD-10-CM | POA: Diagnosis not present

## 2018-01-23 DIAGNOSIS — Z125 Encounter for screening for malignant neoplasm of prostate: Secondary | ICD-10-CM | POA: Diagnosis not present

## 2018-01-23 DIAGNOSIS — E782 Mixed hyperlipidemia: Secondary | ICD-10-CM | POA: Diagnosis not present

## 2018-01-23 DIAGNOSIS — M109 Gout, unspecified: Secondary | ICD-10-CM | POA: Diagnosis not present

## 2018-01-23 DIAGNOSIS — M47816 Spondylosis without myelopathy or radiculopathy, lumbar region: Secondary | ICD-10-CM | POA: Diagnosis not present

## 2018-01-23 DIAGNOSIS — Z1339 Encounter for screening examination for other mental health and behavioral disorders: Secondary | ICD-10-CM | POA: Diagnosis not present

## 2018-01-23 DIAGNOSIS — Z79899 Other long term (current) drug therapy: Secondary | ICD-10-CM | POA: Diagnosis not present

## 2018-01-23 DIAGNOSIS — Z9181 History of falling: Secondary | ICD-10-CM | POA: Diagnosis not present

## 2018-01-23 DIAGNOSIS — Z6829 Body mass index (BMI) 29.0-29.9, adult: Secondary | ICD-10-CM | POA: Diagnosis not present

## 2018-01-23 DIAGNOSIS — Z1331 Encounter for screening for depression: Secondary | ICD-10-CM | POA: Diagnosis not present

## 2018-01-23 DIAGNOSIS — K580 Irritable bowel syndrome with diarrhea: Secondary | ICD-10-CM | POA: Diagnosis not present

## 2018-01-23 DIAGNOSIS — I10 Essential (primary) hypertension: Secondary | ICD-10-CM | POA: Diagnosis not present

## 2018-02-12 DIAGNOSIS — G8929 Other chronic pain: Secondary | ICD-10-CM | POA: Diagnosis not present

## 2018-02-12 DIAGNOSIS — M19011 Primary osteoarthritis, right shoulder: Secondary | ICD-10-CM | POA: Diagnosis not present

## 2018-02-12 DIAGNOSIS — M7541 Impingement syndrome of right shoulder: Secondary | ICD-10-CM | POA: Diagnosis not present

## 2018-04-23 DIAGNOSIS — Z01818 Encounter for other preprocedural examination: Secondary | ICD-10-CM | POA: Diagnosis not present

## 2018-04-23 DIAGNOSIS — H25811 Combined forms of age-related cataract, right eye: Secondary | ICD-10-CM | POA: Diagnosis not present

## 2018-04-23 DIAGNOSIS — H2511 Age-related nuclear cataract, right eye: Secondary | ICD-10-CM | POA: Diagnosis not present

## 2018-05-07 DIAGNOSIS — H25812 Combined forms of age-related cataract, left eye: Secondary | ICD-10-CM | POA: Diagnosis not present

## 2018-05-07 DIAGNOSIS — H2512 Age-related nuclear cataract, left eye: Secondary | ICD-10-CM | POA: Diagnosis not present

## 2018-05-16 ENCOUNTER — Ambulatory Visit (INDEPENDENT_AMBULATORY_CARE_PROVIDER_SITE_OTHER): Payer: PPO | Admitting: Cardiovascular Disease

## 2018-05-16 ENCOUNTER — Encounter: Payer: Self-pay | Admitting: Cardiovascular Disease

## 2018-05-16 VITALS — BP 134/84 | HR 54 | Ht 70.0 in | Wt 212.1 lb

## 2018-05-16 DIAGNOSIS — I2510 Atherosclerotic heart disease of native coronary artery without angina pectoris: Secondary | ICD-10-CM

## 2018-05-16 DIAGNOSIS — I7300 Raynaud's syndrome without gangrene: Secondary | ICD-10-CM | POA: Diagnosis not present

## 2018-05-16 DIAGNOSIS — E782 Mixed hyperlipidemia: Secondary | ICD-10-CM | POA: Diagnosis not present

## 2018-05-16 MED ORDER — ROSUVASTATIN CALCIUM 5 MG PO TABS: 5.0000 mg | ORAL_TABLET | Freq: Every day | ORAL | 3 refills | Status: DC

## 2018-05-16 MED ORDER — DILTIAZEM HCL ER COATED BEADS 120 MG PO CP24: 120.0000 mg | ORAL_CAPSULE | Freq: Every day | ORAL | 3 refills | Status: DC

## 2018-05-16 NOTE — Progress Notes (Signed)
Cardiology Office Note:    Date:  05/16/2018   ID:  Christopher, Perez 1945-10-22, MRN [id number]  PCP:  Street, Sharon Mt, MD  Cardiologist:  Sherren Mocha, MD  Electrophysiologist:  None   Referring MD: Myer Peer, MD   Chief Complaint  Patient presents with  . Coronary Artery Disease    History of Present Illness:    Christopher Perez is a 73 y.o. male with a hx of coronary artery disease, presenting for follow-up evaluation.  The patient presented in 2007 with exertional angina.  He underwent a nuclear scan with high risk features and was found to have critical stenosis of a large obtuse marginal branch of the circumflex.  He underwent PCI with a drug-eluting stent.  He has had no recurrent ischemic events.  He is here alone today. States he's been feeling 'terrible.' He complains of back and knee pain and bilateral knee 'weakness.' He is going to see his orthopedist in the near future. He also had recent cataract surgery and is now seeing well in both eyes. His physical activity has been limited because of problems with arthritis. He is still able to walk 2 miles without chest pain, chest pressure, or shortness of breath.  He does complain of symptoms of Raynauds that have been bothering him quite a bit.  He is having problems with cold fingers and the distal part of his fingers turning white.  Past Medical History:  Diagnosis Date  . Arthritis   . CAD (coronary artery disease)   . GERD (gastroesophageal reflux disease)    OCC  . HLD (hyperlipidemia)   . HTN (hypertension)   . Neuromuscular disorder ([place])    NUMB/ TINGLING TOES  . Stroke [facility])    ? TIA    5-6 YRS AGO    Past Surgical History:  Procedure Laterality Date  . CARDIAC CATHETERIZATION    . CORONARY ANGIOPLASTY     STENT PLACED  03/2006  . FOOT SURGERY     RT FOOT  . KNEE ARTHROSCOPY     RT KNEE ,  MENISCUS REP    Current Medications: Current Meds  Medication Sig  . aspirin 81 MG tablet  Take 81 mg by mouth daily.   . cyclobenzaprine (FLEXERIL) 10 MG tablet Take 1 tablet (10 mg total) by mouth 3 (three) times daily as needed for muscle spasms.  Marland Kitchen dicyclomine (BENTYL) 20 MG tablet Take 20 mg by mouth every 4 (four) hours as needed for spasms (STOMACH).   Marland Kitchen metoprolol (LOPRESSOR) 50 MG tablet Take 50 mg by mouth daily.     Allergies:   Statins and Praluent [alirocumab]   Social History   Socioeconomic History  . Marital status: Married    Spouse name: Not on file  . Number of children: 1  . Years of education: Not on file  . Highest education level: Not on file  Occupational History  . Occupation: Information systems manager  Social Needs  . Financial resource strain: Not on file  . Food insecurity:    Worry: Not on file    Inability: Not on file  . Transportation needs:    Medical: Not on file    Non-medical: Not on file  Tobacco Use  . Smoking status: Former Smoker    Last attempt to quit: 04/10/1983    Years since quitting: 35.1  . Smokeless tobacco: Former Network engineer and Sexual Activity  . Alcohol use: Yes    Alcohol/week: 14.0  standard drinks    Types: 14 Standard drinks or equivalent per week    Comment: OCC  . Drug use: No  . Sexual activity: Not on file  Lifestyle  . Physical activity:    Days per week: Not on file    Minutes per session: Not on file  . Stress: Not on file  Relationships  . Social connections:    Talks on phone: Not on file    Gets together: Not on file    Attends religious service: Not on file    Active member of club or organization: Not on file    Attends meetings of clubs or organizations: Not on file    Relationship status: Not on file  Other Topics Concern  . Not on file  Social History Narrative  . Not on file     Family History: The patient's family history is negative for Heart attack, Stroke, and Hypertension.  ROS:   Please see the history of present illness.    All other systems reviewed and are  negative.  EKGs/Labs/Other Studies Reviewed:    EKG:  EKG is ordered today.  The ekg ordered today demonstrates sinus bradycardia 54 bpm, otherwise within normal limits.  Recent Labs: No results found for requested labs within last 8760 hours.  Recent Lipid Panel    Component Value Date/Time   CHOL 164 01/09/2017 0944   TRIG 165 (H) 01/09/2017 0944   HDL 53 01/09/2017 0944   CHOLHDL 3.1 01/09/2017 0944   CHOLHDL 3.1 11/11/2015 1050   VLDL 36 (H) 11/11/2015 1050   LDLCALC 78 01/09/2017 0944   LDLDIRECT 188.0 11/09/2014 0734    Physical Exam:    VS:  BP 134/84   Pulse (!) 54   Ht 5\' 10"  (1.778 m)   Wt 212 lb 1.9 oz (96.2 kg)   SpO2 97%   BMI 30.44 kg/m     Wt Readings from Last 3 Encounters:  05/16/18 212 lb 1.9 oz (96.2 kg)  12/16/17 208 lb 6.4 oz (94.5 kg)  09/09/17 209 lb 6.4 oz (95 kg)     GEN: Well nourished, well developed in no acute distress HEENT: Normal NECK: No JVD; No carotid bruits LYMPHATICS: No lymphadenopathy CARDIAC: RRR, no murmurs, rubs, gallops RESPIRATORY:  Clear to auscultation without rales, wheezing or rhonchi  ABDOMEN: Soft, non-tender, non-distended MUSCULOSKELETAL:  No edema; No deformity  SKIN: Warm and dry NEUROLOGIC:  Alert and oriented x 3 PSYCHIATRIC:  Normal affect   ASSESSMENT:    1. Coronary artery disease involving native coronary artery of native heart without angina pectoris   2. Mixed hyperlipidemia   3. Raynaud's disease without gangrene    PLAN:    In order of problems listed above:  1. The patient is stable without symptoms of angina.  He remains on aspirin for antiplatelet therapy.  LV function has been preserved. 2. The patient's lipids are reviewed and well above goal.  LDL cholesterol is greater than 160 mg/dL.  He has been intolerant to statins and PCSK9 inhibitors.  However, he has significant arthritis and I think he has attributed some of his statin side effects to arthritis.  He is willing to try Crestor at  low-dose 5 mg daily.  We will repeat lipids and LFTs in 3 months. 3. We will discontinue the patient's metoprolol and try him on extended release diltiazem 120 mg daily.  Medication Adjustments/Labs and Tests Ordered: Current medicines are reviewed at length with the patient today.  Concerns  regarding medicines are outlined above.  No orders of the defined types were placed in this encounter.  No orders of the defined types were placed in this encounter.   There are no Patient Instructions on file for this visit.   Signed, Sherren Mocha, MD  05/16/2018 9:08 AM    [facility]

## 2018-05-16 NOTE — Patient Instructions (Signed)
Medication Instructions:  1) STOP METOPROLOL 2) START DILTIAZEM 120 mg daily 3) START CRESTOR 5 mg daily  Labwork: Your provider recommends that you return for lab work in 3 months.  Testing/Procedures: None ordered.  Follow-Up: Your provider wants you to follow-up in: 1 year with Dr. Burt Knack or his assistant. You will receive a reminder letter in the mail two months in advance. If you don't receive a letter, please call our office to schedule the follow-up appointment.

## 2018-05-27 DIAGNOSIS — M25561 Pain in right knee: Secondary | ICD-10-CM | POA: Diagnosis not present

## 2018-05-27 DIAGNOSIS — M25562 Pain in left knee: Secondary | ICD-10-CM | POA: Diagnosis not present

## 2018-05-27 DIAGNOSIS — G8929 Other chronic pain: Secondary | ICD-10-CM | POA: Diagnosis not present

## 2018-05-27 DIAGNOSIS — M170 Bilateral primary osteoarthritis of knee: Secondary | ICD-10-CM | POA: Diagnosis not present

## 2018-06-09 DIAGNOSIS — L237 Allergic contact dermatitis due to plants, except food: Secondary | ICD-10-CM | POA: Diagnosis not present

## 2018-06-09 DIAGNOSIS — L299 Pruritus, unspecified: Secondary | ICD-10-CM | POA: Diagnosis not present

## 2018-06-13 ENCOUNTER — Encounter: Payer: Medicare Other | Admitting: *Deleted

## 2018-06-13 DIAGNOSIS — Z006 Encounter for examination for normal comparison and control in clinical research program: Secondary | ICD-10-CM

## 2018-06-13 NOTE — Research (Signed)
Lora Paula to research clinic for visit [id number] in the Clear study. Subject has not been on IP since the end of October and does no wish to restart at this time. Subject was started on Rosuvastatin 5 mg daily and Diltiazem 120 mg daily on 05/16/2018. metoPROLOL succinate was discontinued on 05/26/2018.  No adverse events, or serious adverse events to report.  Subject 2% compliant with medications.Diet and exercise counseling was provided to subject. Next appointment scheduled.

## 2018-06-18 DIAGNOSIS — K635 Polyp of colon: Secondary | ICD-10-CM | POA: Diagnosis not present

## 2018-06-18 DIAGNOSIS — D122 Benign neoplasm of ascending colon: Secondary | ICD-10-CM | POA: Diagnosis not present

## 2018-06-18 DIAGNOSIS — Z86010 Personal history of colonic polyps: Secondary | ICD-10-CM | POA: Diagnosis not present

## 2018-06-18 DIAGNOSIS — Z1211 Encounter for screening for malignant neoplasm of colon: Secondary | ICD-10-CM | POA: Diagnosis not present

## 2018-08-11 ENCOUNTER — Other Ambulatory Visit: Payer: Self-pay

## 2018-08-11 ENCOUNTER — Other Ambulatory Visit: Payer: PPO | Admitting: *Deleted

## 2018-08-11 DIAGNOSIS — I2510 Atherosclerotic heart disease of native coronary artery without angina pectoris: Secondary | ICD-10-CM | POA: Diagnosis not present

## 2018-08-11 DIAGNOSIS — E782 Mixed hyperlipidemia: Secondary | ICD-10-CM | POA: Diagnosis not present

## 2018-08-11 LAB — LIPID PANEL
Chol/HDL Ratio: 3.9 ratio (ref 0.0–5.0)
Cholesterol, Total: 228 mg/dL — ABNORMAL HIGH (ref 100–199)
HDL: 59 mg/dL (ref >39)
LDL Calculated: 136 mg/dL — ABNORMAL HIGH (ref 0–99)
Triglycerides: 165 mg/dL — ABNORMAL HIGH (ref 0–149)
VLDL Cholesterol Cal: 33 mg/dL (ref 5–40)

## 2018-08-11 LAB — HEPATIC FUNCTION PANEL
ALT: 20 IU/L (ref 0–44)
AST: 19 IU/L (ref 0–40)
Albumin: 4.6 g/dL (ref 3.7–4.7)
Alkaline Phosphatase: 56 IU/L (ref 39–117)
Bilirubin Total: 0.3 mg/dL (ref 0.0–1.2)
Bilirubin, Direct: 0.09 mg/dL (ref 0.00–0.40)
Total Protein: 6.8 g/dL (ref 6.0–8.5)

## 2018-08-26 DIAGNOSIS — S50812A Abrasion of left forearm, initial encounter: Secondary | ICD-10-CM | POA: Diagnosis not present

## 2018-09-04 ENCOUNTER — Encounter: Payer: Self-pay | Admitting: Cardiovascular Disease

## 2018-09-04 NOTE — Telephone Encounter (Signed)
Patient is called for his lab results from May 4th, can leave VM with lab results.

## 2018-09-04 NOTE — Telephone Encounter (Signed)
This encounter was created in error - please disregard.

## 2018-09-09 DIAGNOSIS — I10 Essential (primary) hypertension: Secondary | ICD-10-CM | POA: Diagnosis not present

## 2018-09-09 DIAGNOSIS — M545 Low back pain: Secondary | ICD-10-CM | POA: Diagnosis not present

## 2018-09-09 DIAGNOSIS — Z6830 Body mass index (BMI) 30.0-30.9, adult: Secondary | ICD-10-CM | POA: Diagnosis not present

## 2018-09-09 DIAGNOSIS — M4316 Spondylolisthesis, lumbar region: Secondary | ICD-10-CM | POA: Diagnosis not present

## 2018-09-18 ENCOUNTER — Telehealth: Payer: Self-pay | Admitting: *Deleted

## 2018-09-18 NOTE — Telephone Encounter (Signed)
Called patient for visit T9-M21 in the Clear research study.  No aes or saes to report.  Subject not on IP at the present time.  Chart check and medication check performed.

## 2018-09-19 ENCOUNTER — Telehealth: Payer: Self-pay | Admitting: *Deleted

## 2018-09-19 NOTE — Telephone Encounter (Signed)
Patient called to talk about possibly resuming IP.  Talked with him about what he's taking now, COQ10, and bempedoic acid.  Very appreciative of taking time to speak with him.  Talked about his general well being.

## 2018-10-08 ENCOUNTER — Telehealth: Payer: Self-pay | Admitting: *Deleted

## 2018-10-08 NOTE — Telephone Encounter (Addendum)
Patient called and wants to re-challenge Clear IP. He is a participant in the study already and needs additional drug.  He was off IP but would like to re-try.  Received verbal consent to [employer] drug to home address.  Will re-dispense as unscheduled and [employer] to his home since not yet resumed clear visits in the clinic.

## 2018-12-10 ENCOUNTER — Other Ambulatory Visit: Payer: Self-pay

## 2018-12-10 ENCOUNTER — Encounter: Payer: PPO | Admitting: *Deleted

## 2018-12-10 VITALS — BP 145/81 | HR 62 | Wt 209.3 lb

## 2018-12-10 DIAGNOSIS — Z006 Encounter for examination for normal comparison and control in clinical research program: Secondary | ICD-10-CM

## 2018-12-10 NOTE — Research (Signed)
Subject to research clinic for visit T8M18 in the Clear research study.  Subject has no cos, aes or saes to report.  Tolerating medication well.  Re-consented to [place] version 7.0 12Mar2020, local version 15Apr2020.  Clear  Informed Consent   Subject Name: Christopher Perez  Subject met inclusion and exclusion criteria.  The informed consent form, study requirements and expectations were reviewed with the subject and questions and concerns were addressed prior to the signing of the consent form.  The subject verbalized understanding of the trial requirements.  The subject agreed to participate in the Clear trial and signed the informed consent on 02Sep2020. The informed consent was obtained prior to performance of any protocol-specific procedures for the subject.  A copy of the signed informed consent was given to the subject and a copy was placed in the subject's medical record.   Oletta Cohn.   97% compliant with meds, next phone call and clinic visit scheduled.

## 2019-01-09 DIAGNOSIS — J329 Chronic sinusitis, unspecified: Secondary | ICD-10-CM | POA: Diagnosis not present

## 2019-01-09 DIAGNOSIS — J989 Respiratory disorder, unspecified: Secondary | ICD-10-CM | POA: Diagnosis not present

## 2019-01-09 DIAGNOSIS — Z6829 Body mass index (BMI) 29.0-29.9, adult: Secondary | ICD-10-CM | POA: Diagnosis not present

## 2019-02-25 DIAGNOSIS — E782 Mixed hyperlipidemia: Secondary | ICD-10-CM | POA: Diagnosis not present

## 2019-02-25 DIAGNOSIS — K580 Irritable bowel syndrome with diarrhea: Secondary | ICD-10-CM | POA: Diagnosis not present

## 2019-02-25 DIAGNOSIS — Z1331 Encounter for screening for depression: Secondary | ICD-10-CM | POA: Diagnosis not present

## 2019-02-25 DIAGNOSIS — I10 Essential (primary) hypertension: Secondary | ICD-10-CM | POA: Diagnosis not present

## 2019-02-25 DIAGNOSIS — R0789 Other chest pain: Secondary | ICD-10-CM | POA: Diagnosis not present

## 2019-02-25 DIAGNOSIS — G4700 Insomnia, unspecified: Secondary | ICD-10-CM | POA: Diagnosis not present

## 2019-02-25 DIAGNOSIS — E663 Overweight: Secondary | ICD-10-CM | POA: Diagnosis not present

## 2019-02-25 DIAGNOSIS — Z6829 Body mass index (BMI) 29.0-29.9, adult: Secondary | ICD-10-CM | POA: Diagnosis not present

## 2019-02-25 DIAGNOSIS — Z0000 Encounter for general adult medical examination without abnormal findings: Secondary | ICD-10-CM | POA: Diagnosis not present

## 2019-02-25 DIAGNOSIS — Z9181 History of falling: Secondary | ICD-10-CM | POA: Diagnosis not present

## 2019-02-26 DIAGNOSIS — E291 Testicular hypofunction: Secondary | ICD-10-CM | POA: Diagnosis not present

## 2019-03-19 ENCOUNTER — Telehealth: Payer: Self-pay | Admitting: Cardiovascular Disease

## 2019-03-19 DIAGNOSIS — E782 Mixed hyperlipidemia: Secondary | ICD-10-CM

## 2019-03-19 NOTE — Telephone Encounter (Signed)
New message    Patient requesting lab order for annual labs

## 2019-03-27 ENCOUNTER — Telehealth: Payer: Self-pay | Admitting: *Deleted

## 2019-03-27 DIAGNOSIS — Z006 Encounter for examination for normal comparison and control in clinical research program: Secondary | ICD-10-CM

## 2019-03-27 NOTE — Telephone Encounter (Signed)
Scheduled patient for fasting blood work 3/22. He was grateful for assistance.

## 2019-03-27 NOTE — Telephone Encounter (Signed)
Phone call/chart review for visit T9M21 in the clear research study.  Next visit scheduled at an earlier visit.

## 2019-03-31 DIAGNOSIS — Z6829 Body mass index (BMI) 29.0-29.9, adult: Secondary | ICD-10-CM | POA: Diagnosis not present

## 2019-03-31 DIAGNOSIS — I10 Essential (primary) hypertension: Secondary | ICD-10-CM | POA: Diagnosis not present

## 2019-03-31 DIAGNOSIS — M4316 Spondylolisthesis, lumbar region: Secondary | ICD-10-CM | POA: Diagnosis not present

## 2019-04-19 ENCOUNTER — Other Ambulatory Visit: Payer: Self-pay | Admitting: Cardiovascular Disease

## 2019-04-21 NOTE — Telephone Encounter (Signed)
Ok to fill 

## 2019-05-21 ENCOUNTER — Other Ambulatory Visit: Payer: Self-pay

## 2019-05-21 ENCOUNTER — Encounter: Payer: Self-pay | Admitting: Cardiovascular Disease

## 2019-05-21 ENCOUNTER — Ambulatory Visit: Payer: PPO | Admitting: Cardiovascular Disease

## 2019-05-21 VITALS — BP 148/82 | HR 57 | Ht 70.0 in | Wt 211.2 lb

## 2019-05-21 DIAGNOSIS — E782 Mixed hyperlipidemia: Secondary | ICD-10-CM

## 2019-05-21 DIAGNOSIS — I7300 Raynaud's syndrome without gangrene: Secondary | ICD-10-CM | POA: Diagnosis not present

## 2019-05-21 DIAGNOSIS — I6523 Occlusion and stenosis of bilateral carotid arteries: Secondary | ICD-10-CM | POA: Diagnosis not present

## 2019-05-21 DIAGNOSIS — I2510 Atherosclerotic heart disease of native coronary artery without angina pectoris: Secondary | ICD-10-CM | POA: Diagnosis not present

## 2019-05-21 LAB — LIPID PANEL
Chol/HDL Ratio: 3.8 ratio (ref 0.0–5.0)
Cholesterol, Total: 221 mg/dL — ABNORMAL HIGH (ref 100–199)
HDL: 58 mg/dL (ref >39)
LDL Chol Calc (NIH): 134 mg/dL — ABNORMAL HIGH (ref 0–99)
Triglycerides: 162 mg/dL — ABNORMAL HIGH (ref 0–149)
VLDL Cholesterol Cal: 29 mg/dL (ref 5–40)

## 2019-05-21 LAB — COMPREHENSIVE METABOLIC PANEL
ALT: 17 IU/L (ref 0–44)
AST: 22 IU/L (ref 0–40)
Albumin/Globulin Ratio: 1.4 (ref 1.2–2.2)
Albumin: 4.3 g/dL (ref 3.7–4.7)
Alkaline Phosphatase: 71 IU/L (ref 39–117)
BUN/Creatinine Ratio: 13 (ref 10–24)
BUN: 11 mg/dL (ref 8–27)
Bilirubin Total: 0.4 mg/dL (ref 0.0–1.2)
CO2: 24 mmol/L (ref 20–29)
Calcium: 9.4 mg/dL (ref 8.6–10.2)
Chloride: 101 mmol/L (ref 96–106)
Creatinine, Ser: 0.85 mg/dL (ref 0.76–1.27)
GFR calc Af Amer: 100 mL/min/{1.73_m2} (ref >59)
GFR calc non Af Amer: 86 mL/min/{1.73_m2} (ref >59)
Globulin, Total: 3 g/dL (ref 1.5–4.5)
Glucose: 97 mg/dL (ref 65–99)
Potassium: 4.2 mmol/L (ref 3.5–5.2)
Sodium: 138 mmol/L (ref 134–144)
Total Protein: 7.3 g/dL (ref 6.0–8.5)

## 2019-05-21 MED ORDER — SILDENAFIL CITRATE 50 MG PO TABS: 50.0000 mg | ORAL_TABLET | Freq: Every day | ORAL | 1 refills | Status: DC | PRN

## 2019-05-21 NOTE — Progress Notes (Signed)
Cardiology Office Note:    Date:  05/21/2019   ID:  Perez, Christopher [birth year], MRN [id number]  PCP:  Perez, Christopher Mt, MD  Cardiologist:  Sherren Mocha, MD  Electrophysiologist:  None   Referring MD: Perez, Christopher Mt, *   Chief Complaint  Patient presents with  . Coronary Artery Disease    History of Present Illness:    Christopher Perez is a 74 y.o. male with a hx of coronary artery disease, presenting for follow-up evaluation.  The patient presented in 2007 with exertional angina.  He underwent a nuclear scan with high risk features and was found to have critical stenosis of a large obtuse marginal branch of the circumflex.  He underwent PCI with a drug-eluting stent.  He has had no recurrent ischemic events.  The patient is here alone today.  He is doing well from a cardiac perspective.  He denies chest pain, chest pressure, shortness of breath, leg swelling, or heart palpitations.  He does complain of erectile dysfunction and inquires about medication today.  Past Medical History:  Diagnosis Date  . Arthritis   . CAD (coronary artery disease)   . GERD (gastroesophageal reflux disease)    OCC  . HLD (hyperlipidemia)   . HTN (hypertension)   . Neuromuscular disorder ([place])    NUMB/ TINGLING TOES  . Stroke [facility])    ? TIA    5-6 YRS AGO    Past Surgical History:  Procedure Laterality Date  . CARDIAC CATHETERIZATION    . CORONARY ANGIOPLASTY     STENT PLACED  03/2006  . FOOT SURGERY     RT FOOT  . KNEE ARTHROSCOPY     RT KNEE ,  MENISCUS REP    Current Medications: Current Meds  Medication Sig  . aspirin 81 MG tablet Take 81 mg by mouth daily.   Marland Kitchen dicyclomine (BENTYL) 20 MG tablet Take 20 mg by mouth every 4 (four) hours as needed for spasms (STOMACH).   Marland Kitchen diltiazem (DILTIAZEM CD) 120 MG 24 hr capsule Take 1 capsule (120 mg total) by mouth daily.  . rosuvastatin (CRESTOR) 5 MG tablet Take 1 tablet (5 mg total) by mouth daily. Please keep  upcoming appt for refills. Thank you  . Study - CLEAR-OUTCOMES - bempedoic acid 180 mg or placebo tablet (PI-Crenshaw) Take 1 tablet by mouth daily.  . TURMERIC PO Take 1 tablet by mouth every other day.  Marland Kitchen Ubiquinol (QUNOL COQ10/UBIQUINOL/MEGA) 100 MG CAPS Take 1 capsule by mouth daily.  Marland Kitchen zinc gluconate 50 MG tablet Take 50 mg by mouth daily.     Allergies:   Statins and Praluent [alirocumab]   Social History   Socioeconomic History  . Marital status: Married    Spouse name: Not on file  . Number of children: 1  . Years of education: Not on file  . Highest education level: Not on file  Occupational History  . Occupation: Information systems manager  Tobacco Use  . Smoking status: Former Smoker    Quit date: 04/10/1983    Years since quitting: 36.1  . Smokeless tobacco: Former Network engineer and Sexual Activity  . Alcohol use: Yes    Alcohol/week: 14.0 standard drinks    Types: 14 Standard drinks or equivalent per week    Comment: OCC  . Drug use: No  . Sexual activity: Not on file  Other Topics Concern  . Not on file  Social History Narrative  . Not on file  Social Determinants of Health   Financial Resource Strain:   . Difficulty of Paying Living Expenses: Not on file  Food Insecurity:   . Worried About Charity fundraiser in the Last Year: Not on file  . Ran Out of Food in the Last Year: Not on file  Transportation Needs:   . Lack of Transportation (Medical): Not on file  . Lack of Transportation (Non-Medical): Not on file  Physical Activity:   . Days of Exercise per Week: Not on file  . Minutes of Exercise per Session: Not on file  Stress:   . Feeling of Stress : Not on file  Social Connections:   . Frequency of Communication with Friends and Family: Not on file  . Frequency of Social Gatherings with Friends and Family: Not on file  . Attends Religious Services: Not on file  . Active Member of Clubs or Organizations: Not on file  . Attends Theatre manager Meetings: Not on file  . Marital Status: Not on file     Family History: The patient's family history is negative for Heart attack, Stroke, and Hypertension.  ROS:   Please see the history of present illness.    All other systems reviewed and are negative.  EKGs/Labs/Other Studies Reviewed:    EKG:  EKG is ordered today.  The ekg ordered today demonstrates normal sinus rhythm 57 bpm, significant baseline artifact limits interpretation, otherwise normal.  Recent Labs: 08/11/2018: ALT 20  Recent Lipid Panel    Component Value Date/Time   CHOL 228 (H) 08/11/2018 0735   TRIG 165 (H) 08/11/2018 0735   HDL 59 08/11/2018 0735   CHOLHDL 3.9 08/11/2018 0735   CHOLHDL 3.1 11/11/2015 1050   VLDL 36 (H) 11/11/2015 1050   LDLCALC 136 (H) 08/11/2018 0735   LDLDIRECT 188.0 11/09/2014 0734    Physical Exam:    VS:  BP (!) 148/82   Pulse (!) 57   Ht 5\' 10"  (1.778 m)   Wt 211 lb 3.2 oz (95.8 kg)   SpO2 97%   BMI 30.30 kg/m     Wt Readings from Last 3 Encounters:  05/21/19 211 lb 3.2 oz (95.8 kg)  12/10/18 209 lb 4.8 oz (94.9 kg)  05/16/18 212 lb 1.9 oz (96.2 kg)     GEN: Well nourished, well developed in no acute distress HEENT: Normal NECK: No JVD; No carotid bruits LYMPHATICS: No lymphadenopathy CARDIAC: RRR, no murmurs, rubs, gallops RESPIRATORY:  Clear to auscultation without rales, wheezing or rhonchi  ABDOMEN: Soft, non-tender, non-distended MUSCULOSKELETAL:  No edema; No deformity  SKIN: Warm and dry NEUROLOGIC:  Alert and oriented x 3 PSYCHIATRIC:  Normal affect   ASSESSMENT:    1. Mixed hyperlipidemia   2. Coronary artery disease involving native coronary artery of native heart without angina pectoris   3. Raynaud's disease without gangrene   4. Atherosclerosis of both carotid arteries    PLAN:    In order of problems listed above:  1. Statin dose limited by arthralgias and myalgias.  We discussed consideration of pitavastatin today.  He prefers  to continue on low-dose rosuvastatin.  Will update lipids and LFTs. 2. Doing well on low-dose aspirin.  Raynaud's symptoms improved on a calcium channel blocker.  No changes recommended. 3. Improved with use of diltiazem. 4. Reviewed last duplex from 2016 with <40% stenosis bilaterally. 2 year follow-up US was recommended. Will order. No bruit and no stroke/TIA symptoms.    Medication Adjustments/Labs and Tests Ordered: Current  medicines are reviewed at length with the patient today.  Concerns regarding medicines are outlined above.  Orders Placed This Encounter  Procedures  . Comprehensive metabolic panel  . Lipid panel  . EKG 12-Lead   Meds ordered this encounter  Medications  . sildenafil (VIAGRA) 50 MG tablet    Sig: Take 1 tablet (50 mg total) by mouth daily as needed for erectile dysfunction.    Dispense:  30 tablet    Refill:  1    Patient Instructions  Medication Instructions:  1) you have been given a prescription for Sildenafil 50mg . You may take 1-2 tablets once daily as needed prior to sexual activity.  *If you need a refill on your cardiac medications before your next appointment, please call your pharmacy*  Lab Work: TODAY: CMET, lipids If you have labs (blood work) drawn today and your tests are completely normal, you will receive your results only by: Marland Kitchen MyChart Message (if you have MyChart) OR . A paper copy in the mail If you have any lab test that is abnormal or we need to change your treatment, we will call you to review the results.  Follow-Up: At [facility], you and your health needs are our priority.  As part of our continuing mission to provide you with exceptional heart care, we have created designated Provider Care Teams.  These Care Teams include your primary Cardiologist (physician) and Advanced Practice Providers (APPs -  Physician Assistants and Nurse Practitioners) who all work together to provide you with the care you need, when you need it. Your  next appointment:   12 month(s) The format for your next appointment:   In Person Provider:   You may see Sherren Mocha, MD or one of the following Advanced Practice Providers on your designated Care Team:    Richardson Dopp, PA-C  Vin [place], PA-C  Daune Perch, [place]     Signed, Sherren Mocha, MD  05/21/2019 9:56 AM    [place]

## 2019-05-21 NOTE — Addendum Note (Signed)
Addended by: Harland German A on: 05/21/2019 10:17 AM   Modules accepted: Orders

## 2019-05-21 NOTE — Patient Instructions (Addendum)
Medication Instructions:  1) you have been given a prescription for Sildenafil 50mg . You may take 1-2 tablets once daily as needed prior to sexual activity.  *If you need a refill on your cardiac medications before your next appointment, please call your pharmacy*  Lab Work: TODAY: CMET, lipids If you have labs (blood work) drawn today and your tests are completely normal, you will receive your results only by: Marland Kitchen MyChart Message (if you have MyChart) OR . A paper copy in the mail If you have any lab test that is abnormal or we need to change your treatment, we will call you to review the results.   Follow-Up: At [facility], you and your health needs are our priority.  As part of our continuing mission to provide you with exceptional heart care, we have created designated Provider Care Teams.  These Care Teams include your primary Cardiologist (physician) and Advanced Practice Providers (APPs -  Physician Assistants and Nurse Practitioners) who all work together to provide you with the care you need, when you need it. Your next appointment:   12 month(s) The format for your next appointment:   In Person Provider:   You may see Sherren Mocha, MD or one of the following Advanced Practice Providers on your designated Care Team:    Richardson Dopp, PA-C  Vin [place], [place]  Daune Perch, [place]

## 2019-05-25 ENCOUNTER — Telehealth: Payer: Self-pay

## 2019-05-25 DIAGNOSIS — E782 Mixed hyperlipidemia: Secondary | ICD-10-CM

## 2019-05-25 MED ORDER — ROSUVASTATIN CALCIUM 10 MG PO TABS: 10.0000 mg | ORAL_TABLET | Freq: Every day | ORAL | 3 refills | Status: DC

## 2019-05-25 MED ORDER — EZETIMIBE 10 MG PO TABS: 10.0000 mg | ORAL_TABLET | Freq: Every day | ORAL | 3 refills | Status: DC

## 2019-05-25 NOTE — Telephone Encounter (Signed)
-----   Message from Sherren Mocha, MD sent at 05/24/2019 10:29 AM EST ----- If he is willing, would increase crestor to 10 mg daily and add zetia 10 mg daily. LDL is significantly above goal. Repeat labs 3 months. thanks

## 2019-05-25 NOTE — Telephone Encounter (Signed)
Reviewed results with patient who verbalized understanding.   Instructed patient to INCREASE CRESTOR to 10 mg daily. Instructed him to START ZETIA 10 mg daily. FLP and LFTs scheduled 5/17. He was grateful for call and agrees with treatment plan.

## 2019-06-04 ENCOUNTER — Other Ambulatory Visit: Payer: Self-pay

## 2019-06-04 ENCOUNTER — Ambulatory Visit (HOSPITAL_COMMUNITY)
Admission: RE | Admit: 2019-06-04 | Discharge: 2019-06-04 | Disposition: A | Discharge status: Home / Self Care | Payer: PPO | Source: Ambulatory Visit | Attending: Cardiology | Admitting: Cardiology

## 2019-06-04 DIAGNOSIS — I6523 Occlusion and stenosis of bilateral carotid arteries: Secondary | ICD-10-CM | POA: Insufficient documentation

## 2019-06-10 ENCOUNTER — Other Ambulatory Visit: Payer: Self-pay

## 2019-06-10 ENCOUNTER — Encounter: Payer: PPO | Admitting: *Deleted

## 2019-06-10 VITALS — BP 142/79 | HR 59 | Wt 211.6 lb

## 2019-06-10 DIAGNOSIS — Z006 Encounter for examination for normal comparison and control in clinical research program: Secondary | ICD-10-CM

## 2019-06-10 NOTE — Research (Signed)
Patient to research clinic for visit T10-M24 in the clear research study.  No aes or saes to report.  Next clinic visit and phone call scheduled.

## 2019-06-18 ENCOUNTER — Telehealth: Payer: Self-pay | Admitting: *Deleted

## 2019-06-18 DIAGNOSIS — Z006 Encounter for examination for normal comparison and control in clinical research program: Secondary | ICD-10-CM

## 2019-06-18 NOTE — Telephone Encounter (Signed)
Patient called Research clinic to inform office that he is taking a drug holiday.  He has developed myalgias and has stopped taking rosuvastatin 10 mg, ezetimibe 10 mg and the IP. Mylgias are involving hips, calves and knees. Encouraged patient to call the cardiology office and let them know.

## 2019-06-19 ENCOUNTER — Telehealth: Payer: Self-pay | Admitting: Cardiovascular Disease

## 2019-06-19 NOTE — Telephone Encounter (Signed)
The patient reports he stopped Crestor 10 mg qd and Zetia 10 mg qd because the muscle weakness and discomfort in his legs was too much. He tries to walk every day and it became too uncomfortable. He even got to where he'd have to physically pick up his legs with his hands to get in his truck. Since he stopped taking the medications 3/3, his symptoms are slowly improving. He is still walking everyday and it is becoming easier and easier.   He "tolerated" Crestor 5 mg daily, but he still had symptoms. The increased dose was just too much.  He requests new medication recommendations.

## 2019-06-19 NOTE — Telephone Encounter (Signed)
Patient will take drug holiday and we will see patient 3/30 to discuss medications options

## 2019-06-19 NOTE — Telephone Encounter (Signed)
Called patient and offered him an appointment in the lipid clinic to discuss options. Patient accepted. He did not have calendar to make appointment and requested we call him back at 10:30

## 2019-06-19 NOTE — Telephone Encounter (Signed)
Patient called and asked that [facility] call him on his cell phone at her earliest convenience.

## 2019-06-26 DIAGNOSIS — Z6830 Body mass index (BMI) 30.0-30.9, adult: Secondary | ICD-10-CM | POA: Diagnosis not present

## 2019-06-26 DIAGNOSIS — K921 Melena: Secondary | ICD-10-CM | POA: Diagnosis not present

## 2019-06-26 DIAGNOSIS — K625 Hemorrhage of anus and rectum: Secondary | ICD-10-CM | POA: Diagnosis not present

## 2019-06-26 DIAGNOSIS — E782 Mixed hyperlipidemia: Secondary | ICD-10-CM | POA: Diagnosis not present

## 2019-06-26 DIAGNOSIS — I2510 Atherosclerotic heart disease of native coronary artery without angina pectoris: Secondary | ICD-10-CM | POA: Diagnosis not present

## 2019-06-26 DIAGNOSIS — E669 Obesity, unspecified: Secondary | ICD-10-CM | POA: Diagnosis not present

## 2019-06-29 ENCOUNTER — Other Ambulatory Visit: Payer: PPO

## 2019-07-03 ENCOUNTER — Ambulatory Visit: Payer: PPO | Admitting: Cardiovascular Disease

## 2019-07-06 NOTE — Progress Notes (Signed)
Patient ID: Christopher Perez                 DOB: 1946/01/22                    MRN: [id number]     HPI: Christopher Perez is a 74 y.o. male patient referred to lipid clinic by Dr. Burt Knack. PMH is significant for CAD s/p PCI with stent in December 2007, HLD, HTN, arthritis, and GERD. He was last seen by Dr. Burt Knack on 05/21/19 where he was started on ezetimibe 10mg  daily and his rosuvastatin was increased to 10mg  daily given his elevated lipid panel. Most recent carotid duplex on 06/04/19 showed 1-39% stenosis in both carotid arteries. On 06/19/19, pt called to inform the office that he was having muscle weakness and discomfort in his hips, calves, and knees on rosuvastatin and ezetimibe and that he would be taking a drug holiday. Of note, pt is currently enrolled in the CLEAR OUTCOMES trial (bempedoic acid vs placebo).  Patient arrives today for initial visit. He has had many intolerances to lipid-lowering medications that are listed below including Praluent, which he states he was on for a very long time before stopping it due to myalgias. He states that he had some muscle weakness on rosuvastatin 5mg  daily, but it was not nearly as severe as the weakness he felt on the 10mg  dose. He is not currently on any lipid-lowering medications. His muscle weakness and discomfort has improved since stopping the medications, but he is still not at his baseline yet. He states that it took about 10 days since stopping the medications for the symptoms to start improving. He also endorses having GI issues with blood in his stool, but he has an appointment with [facility] tomorrow. He used to stay active by walking 2-3 miles 3-4 times weekly but has not been able to do so due to his myalgias. He does enjoy eating biscuits and bacon for breakfast but is willing to work on limiting his intake of foods high in saturated fats to help lower his cholesterol. He is also willing to consider trying Repatha, Welchol, or a very low  dose of rosuvastatin, but he would like to wait 1-2 more weeks to see if his muscle discomfort improves back to his baseline.  Current Medications: none  Intolerances: Myalgias and muscle weakness/discomfort with all of the following:  - Atorvastatin unknown dose (10-15 years ago) - Rosuvastatin 10mg  daily, 5mg  daily - Vytorin - Livalo 2mg  daily - Pravastatin 40mg  daily (2008) - Ezetimibe 10mg  daily - Praluent 75mg  Q2 weeks (took many injections before stopping due to intolerances per pt)  Risk Factors: ASCVD, HLD, HTN, former smoker, BMI >30  LDL goal: < 70 mg/dL  Diet: Enjoys eating "beans, taters, and biscuits." For breakfast, he usually eats oatmeal, cereal, or bacon and eggs. Eats a peanut butter sandwich or ham and cheese sandwich for lunch. Ate creamed potatoes, roast, and green beans for dinner last night. Drinks mainly water and sometimes tea.  Exercise: Has been limited recently due to his medication-induced muscle pain and discomfort. He used to walk 2-3 miles day for 3-4 times weekly prior to his recent myalgias.  Family History: The patient's family history is negative for Heart attack, Stroke, and Hypertension.  Social History: Former smoker ([place]), alcohol use (14 drinks/week)  Labs: 05/21/19: TC 221, TG 162, HDL 58, LDL 134 (rosuvastatin 5mg  daily + CLEAR OUTCOMES trial) 08/11/18: TC 228, TG 165, HDL  59, LDL 136 (rosuvastatin 5mg  daily + CLEAR OUTCOMES trial)   Past Medical History:  Diagnosis Date  . Arthritis   . CAD (coronary artery disease)   . GERD (gastroesophageal reflux disease)    OCC  . HLD (hyperlipidemia)   . HTN (hypertension)   . Neuromuscular disorder ([place])    NUMB/ TINGLING TOES  . Stroke [facility])    ? TIA    5-6 YRS AGO    Current Outpatient Medications on File Prior to Visit  Medication Sig Dispense Refill  . aspirin 81 MG tablet Take 81 mg by mouth daily.     Marland Kitchen dicyclomine (BENTYL) 20 MG tablet Take 20 mg by mouth every 4 (four) hours  as needed for spasms (STOMACH).     Marland Kitchen diltiazem (DILTIAZEM CD) 120 MG 24 hr capsule Take 1 capsule (120 mg total) by mouth daily. 90 capsule 3  . ezetimibe (ZETIA) 10 MG tablet Take 1 tablet (10 mg total) by mouth daily. 90 tablet 3  . rosuvastatin (CRESTOR) 10 MG tablet Take 1 tablet (10 mg total) by mouth daily. 90 tablet 3  . sildenafil (VIAGRA) 50 MG tablet Take 1 tablet (50 mg total) by mouth daily as needed for erectile dysfunction. 30 tablet 1  . Study - CLEAR-OUTCOMES - bempedoic acid 180 mg or placebo tablet (PI-Crenshaw) Take 1 tablet by mouth daily.    . TURMERIC PO Take 1 tablet by mouth every other day.    Marland Kitchen Ubiquinol (QUNOL COQ10/UBIQUINOL/MEGA) 100 MG CAPS Take 1 capsule by mouth daily.    Marland Kitchen zinc gluconate 50 MG tablet Take 50 mg by mouth daily.     No current facility-administered medications on file prior to visit.    Allergies  Allergen Reactions  . Statins Other (See Comments)    Myalgias; Vytorin, Livalo 2 mg qd, Pravastatin 40 mg qd (2008), Crestor 10 mg qd (2008), Crestor 5 mg tiw (03/2013)   . Praluent [Alirocumab]     Myalgias    Assessment/Plan:  1. Hyperlipidemia - LDL remains elevated and above goal of < 70 mg/dL. Patient requests to wait 1-2 more weeks before starting any new medications so that his muscle pain can continue to improve back to his baseline. His treatment options are limited due to his multiple intolerances, but he is willing to try Repatha 140mg  Q2 week injections and/or Welchol 3.75g daily. He will also consider restarting rosuvastatin at a lower dose of 5mg  daily or 5mg  three times weekly if his myalgias improve. Will follow-up with patient via phone call in 1-2 weeks to follow-up on myalgias and revisit these treatment options. Also discussed the possibility of starting inclisiran which is a new lipid-lowering medication that will hopefully become available in the upcoming months. Discussed the importance of maintaining a regular exercise regimen  and sticking to a heart healthy diet low in saturated fats. He will work on limiting his intake of fried foods and restart his regular physical activity once his myalgias are improved.   Richardine Service, PharmD PGY1 Pharmacy Resident

## 2019-07-07 ENCOUNTER — Other Ambulatory Visit: Payer: Self-pay

## 2019-07-07 ENCOUNTER — Ambulatory Visit (INDEPENDENT_AMBULATORY_CARE_PROVIDER_SITE_OTHER): Payer: PPO | Admitting: Pharmacist

## 2019-07-07 DIAGNOSIS — G720 Drug-induced myopathy: Secondary | ICD-10-CM | POA: Diagnosis not present

## 2019-07-07 DIAGNOSIS — E785 Hyperlipidemia, unspecified: Secondary | ICD-10-CM

## 2019-07-07 DIAGNOSIS — T466X5A Adverse effect of antihyperlipidemic and antiarteriosclerotic drugs, initial encounter: Secondary | ICD-10-CM | POA: Insufficient documentation

## 2019-07-07 NOTE — Patient Instructions (Addendum)
It was nice to meet you today   Your LDL is 134 and your goal is < 70  We will give you a call in 1-2 weeks to follow-up on your muscle pain and revisit your treatment options.  If your muscle pain is improved at that time, we can try a very low dose of rosuvastatin 5mg  daily or three times weekly  Repatha is another PCSK9 injection that is stored in the fridge and given every 2 weeks into the fatty tissue of your stomach. This lowers your LDL cholesterol by 60%  Welchol is another medication that is a powder. This can lower your LDL cholesterol and is given once daily by dissolving the powder in water and taken with a meal.  Inclisiran is a new medication that is set to become available soon that can also lower your LDL cholesterol and works differently from other cholesterol-lowering medications   Please call us at [phone] if you have any questions.

## 2019-07-08 ENCOUNTER — Ambulatory Visit: Payer: PPO | Admitting: Physician Assistant

## 2019-07-08 ENCOUNTER — Ambulatory Visit: Payer: PPO | Admitting: Nurse Practitioner

## 2019-07-08 ENCOUNTER — Encounter: Payer: Self-pay | Admitting: Nurse Practitioner

## 2019-07-08 VITALS — BP 140/84 | HR 60 | Temp 97.5°F | Ht 70.0 in | Wt 209.4 lb

## 2019-07-08 DIAGNOSIS — K625 Hemorrhage of anus and rectum: Secondary | ICD-10-CM | POA: Insufficient documentation

## 2019-07-08 DIAGNOSIS — Z86010 Personal history of colonic polyps: Secondary | ICD-10-CM | POA: Diagnosis not present

## 2019-07-08 DIAGNOSIS — K600 Acute anal fissure: Secondary | ICD-10-CM

## 2019-07-08 DIAGNOSIS — K648 Other hemorrhoids: Secondary | ICD-10-CM

## 2019-07-08 MED ORDER — DILTIAZEM GEL 2 %: CUTANEOUS | 0 refills | Status: DC

## 2019-07-08 MED ORDER — DILTIAZEM GEL 2 %: Freq: Three times a day (TID) | CUTANEOUS | Status: DC

## 2019-07-08 MED ORDER — HYDROCORTISONE ACETATE 25 MG RE SUPP: 25.0000 mg | Freq: Every day | RECTAL | 0 refills | Status: DC

## 2019-07-08 MED ORDER — FAMOTIDINE 40 MG PO TABS: 40.0000 mg | ORAL_TABLET | Freq: Every day | ORAL | 1 refills | Status: DC

## 2019-07-08 NOTE — Progress Notes (Signed)
07/08/2019 Christopher Perez [id number] 10-11-1945   CHIEF COMPLAINT: Rectal bleeding   HISTORY OF PRESENT ILLNESS:  Christopher Perez is a 74 year old male with a past medical history of hypertension, hyperlipidemia, CAD and TIA. He presents today for further evaluation regarding rectal bleeding. He was previously taking ASA 52m once daily which he stopped one week ago due to having rectal bleeding. He reported seeing bright red blood on the toilet tissue and darker red blood on the end of his stool on 3/9, 3/16, 3/19, 3/21, 3/22 and 3/26. No associated rectal or abdominal pain. He has some stomach discomfort around 3 to 4am, hunger pain. No N/V. No dysphagia or heartburn. He takes occasional Mylanta with relief. He has a history of colon polyps. His most recent colon[facility] was in 2019 [facility] He stated a few colon polyps were removed. He was advised to repeat a colonoscopy in 5 years. Records from his PCP Dr. CWilliams Chedocuments his most recent colonoscopy was 06/2018. I will request a copy of his most recent colonoscopy report for further review. His cardiologist, Dr. CBurt Knackrecently increased his Crestor from 548mot 1013maily and started Zetia at the same time. No other new medications.  No family history of colorectal cancer.   Laboratory studies 06/26/2019: WBC 6.4. Hemoglobin 13.5. Hematocrit 40.0. MCV 83. Platelet 283. Glucose 92. BUN 13. Creatinine 0.8. Total bili 0.4. AST 19. ALT 16. Alk phos 68. Iron 83. Iron saturation 26. TIBC 317.   Past Medical History:  Diagnosis Date  . Arthritis   . CAD (coronary artery disease)   . GERD (gastroesophageal reflux disease)    OCC  . HLD (hyperlipidemia)   . HTN (hypertension)   . Neuromuscular disorder ([place]  NUMB/ TINGLING TOES  . [facility]  [facility]    5-6 YRS A[facility]   Past Surgical History:  Procedure Laterality Date  . CARDIAC CATHETERIZATION    . CORONARY ANGIOPLASTY     STENT PLACED   03/2006  . FOOT SURGERY     RT FOOT  . KNEE ARTHROSCOPY     RT KNEE ,  MENISCUS REP    reports that he quit smoking about 36 years ago. His smoking use included cigarettes. He has quit using smokeless tobacco. He reports current alcohol use. He reports that he does not use drugs. family history is not on file. Allergies  Allergen Reactions  . Statins Other (See Comments)    Myalgias; Vytorin, Livalo 2 mg qd, Pravastatin 40 mg qd (2008), Crestor 10 mg qd (2008), Crestor 5 mg tiw (03/2013)   . Praluent [Alirocumab]     Myalgias      Outpatient Encounter Medications as of 07/08/2019  Medication Sig  . dicyclomine (BENTYL) 20 MG tablet Take 20 mg by mouth every 4 (four) hours as needed for spasms (STOMACH).   . dMarland Kitchenltiazem (DILTIAZEM CD) 120 MG 24 hr capsule Take 1 capsule (120 mg total) by mouth daily.  . sildenafil (VIAGRA) 50 MG tablet Take 1 tablet (50 mg total) by mouth daily as needed for erectile dysfunction.  . famotidine (PEPCID) 40 MG tablet Take 1 tablet (40 mg total) by mouth at bedtime.  . hydrocortisone (ANUSOL-HC) 25 MG suppository Place 1 suppository (25 mg total) rectally at bedtime.  . [DISCONTINUED] Study - CLEAR-OUTCOMES - bempedoic acid 180 mg or placebo tablet (PI-[place]) Take 1 tablet by mouth daily.  . [DISCONTINUED] TURMERIC  PO Take 1 tablet by mouth every other day.  . [DISCONTINUED] Ubiquinol (QUNOL COQ10/UBIQUINOL/MEGA) 100 MG CAPS Take 1 capsule by mouth daily.  . [DISCONTINUED] zinc gluconate 50 MG tablet Take 50 mg by mouth daily.   Facility-Administered Encounter Medications as of 07/08/2019  Medication  . diltiazem 2 % gel     REVIEW OF SYSTEMS: All other systems reviewed and negative except where noted in the History of Present Illness.   PHYSICAL EXAM: BP 140/84 (BP Location: Left Arm, Patient Position: Sitting, Cuff Size: Normal)   Pulse 60   Temp (!) 97.5 F (36.4 C)   Ht 5' 10"  (1.778 m)   Wt 209 lb 6 oz (95 kg)   SpO2 97%   BMI 30.04  kg/m  General: Well developed  74 year old male in no acute distress. Head: Normocephalic and atraumatic. Eyes:  Sclerae non-icteric, conjunctive pink. Ears: Normal auditory acuity. Mouth: Dentition intact. No ulcers or lesions.  Neck: Supple, no lymphadenopathy or thyromegaly.  Lungs: Clear bilaterally to auscultation without wheezes, crackles or rhonchi. Heart: Regular rate and rhythm. No murmur, rub or gallop appreciated.  Abdomen: Soft, nontender, non distended. No masses. No hepatosplenomegaly. Normoactive bowel sounds x 4 quadrants.  Rectal:  No external hemorrhoids. Small posterior derm fissure oozing a scant amount of blood, anal hemorrhoid with a fissured component within the hemorrhoidal tissue without active bleeding, internal hemorrhoids palpated without prolapse, left lateral hemorrhoidal tag palpated on exam. Scant amount or red blood on the exam glove. Anoscopy exam deferred with concerns scope would cause more anal trauma at this time. Repeat rectal exam with anoscopy in 4 weeks.  Musculoskeletal: Symmetrical with no gross deformities. Skin: Warm and dry. No rash or lesions on visible extremities. Extremities: No edema. Neurological: Alert oriented x 4, no focal deficits.  Psychological:  Alert and cooperative. Normal mood and affect.  ASSESSMENT AND PLAN:  73. 74 year old male with a history of colon polyps presents with rectal bleeding. Rectal exam showed a very small fissure to the posterior anal derm most likely from aggressive wiping, internal hemorrhoids with a small fissured area to the posterior anal hemorrhoid tissue, small hemorrhoidal tag left lateral area palpated. A scan amount of red blood on the exam glove.  -Avoid straining. Discussed Fiber and Miralax as needed. -Diltiazem 2%  Fissure gel tid x 6 weeks and Anusol HC 83m suppository PR x 5 nights. -Request most recent colonoscopy report from Dr. BCarmie Endoffice. -Follow up in the office with Dr. SFuller Planfor  anoscopy evaluation, most likely should have a flexible sigmoidoscopy vs full colonoscopy.  -Patient to call our office if his rectal bleeding worsens   2. CAD, stent 2007 -I informed the patient to go back on ASA 867mdaily and to contact his cardiologist for further ASA instructions.   3. [place] -Famotidine 4021mne po Q HS    CC:  Street, [place]

## 2019-07-08 NOTE — Patient Instructions (Signed)
If you are age 74 or older, your body mass index should be between 23-30. Your Body mass index is 30.04 kg/m. If this is out of the aforementioned range listed, please consider follow up with your Primary Care Provider.  If you are age 34 or younger, your body mass index should be between 19-25. Your Body mass index is 30.04 kg/m. If this is out of the aformentioned range listed, please consider follow up with your Primary Care Provider.   [facility]'s information is below: Address: [address] [place], [place] [address]  [phone]  *Please DO NOT go directly from our office to pick up this medication! Give the pharmacy 1 day to process the prescription as this is compounded and takes time to make.   Due to recent changes in healthcare laws, you may see the results of your imaging and laboratory studies on MyChart before your provider has had a chance to review them.  We understand that in some cases there may be results that are confusing or concerning to you. Not all laboratory results come back in the same time frame and the provider may be waiting for multiple results in order to interpret others.  Please give [place] 48 hours in order for your provider to thoroughly review all the results before contacting the office for clarification of your results.

## 2019-07-08 NOTE — Progress Notes (Signed)
Reviewed and agree with management plan.  Emmerson Shuffield T. Laiah Pouncey, MD FACG [facility]  

## 2019-07-17 ENCOUNTER — Telehealth: Payer: Self-pay | Admitting: Pharmacist

## 2019-07-17 NOTE — Telephone Encounter (Signed)
Called pt to follow up with lipids. He reports feeling better since stopping rosuvastatin and ezetimibe. His muscle strength has improved and joint pain is resolving but still present. Since he is not fully back to his baseline, he prefers to wait another few weeks before starting new medication. Will give pt another call in 2-3 weeks and reassess.

## 2019-08-03 DIAGNOSIS — Z23 Encounter for immunization: Secondary | ICD-10-CM | POA: Diagnosis not present

## 2019-08-07 ENCOUNTER — Telehealth: Payer: Self-pay | Admitting: Pharmacist

## 2019-08-07 NOTE — Telephone Encounter (Signed)
Called pt to follow up with lipids. He is feeling better each week off of his rosuvastatin and ezetimibe. Joint pain continues to improve, he reports almost feeling back to baseline. Prefers for me to call in another 1-2 weeks to discuss starting Repatha. Will go ahead and submit prior authorization now so that it's already on file when pt is ready to start [place]. Will likely need to pursue [employer].

## 2019-08-07 NOTE — Telephone Encounter (Signed)
Prior auth for Repatha approved through 10/06/19. Will call in 2 weeks to see if pt is willing to start therapy then, will also pursue [employer] at that time.

## 2019-08-10 ENCOUNTER — Other Ambulatory Visit: Payer: Self-pay | Admitting: Cardiovascular Disease

## 2019-08-19 ENCOUNTER — Telehealth: Payer: Self-pay | Admitting: Cardiovascular Disease

## 2019-08-19 NOTE — Telephone Encounter (Signed)
   Pt c/o medication issue:  1. Name of Medication: Repatha  2. How are you currently taking this medication (dosage and times per day)?   3. Are you having a reaction (difficulty breathing--STAT)?   4. What is your medication issue? Pt wanted to speak with Fuller [place] he said he has some question about Repatha

## 2019-08-19 NOTE — Telephone Encounter (Signed)
Called and spoke with patient. He had a question about the date of his Repatha approval as its only good until June. I explained that sometimes insurance will only give a few months and then want [place] to submit documents that its working. Advised we will handle all of that. Lab appointment for next week that was scheduled back in feb- I canceled since patient is not on therapy. He has not made a decision and would like Megan to call him when she gets back for vacation and caught up.

## 2019-08-24 ENCOUNTER — Other Ambulatory Visit: Payer: PPO

## 2019-08-25 NOTE — Telephone Encounter (Signed)
Called pt - he had questions about the prior auth approval letter he received. States he is still not back to his baseline and wants to call clinic in another few weeks when he will hopefully be ready to start [place]. Of note, prior auth was approved for a short duration only until 10/06/19 since he has [employer]. Will also likely need to pursue [employer] once pt does decide to start [place].

## 2019-09-25 ENCOUNTER — Telehealth: Payer: Self-pay | Admitting: *Deleted

## 2019-09-25 DIAGNOSIS — Z006 Encounter for examination for normal comparison and control in clinical research program: Secondary | ICD-10-CM

## 2019-09-25 NOTE — Telephone Encounter (Signed)
LMV to call [facility] [phone] for Clear T11-M27 phone visit.

## 2019-09-28 ENCOUNTER — Telehealth: Payer: Self-pay | Admitting: *Deleted

## 2019-09-28 NOTE — Telephone Encounter (Signed)
Called patient/medical record check for CLEAR visit T11,M27. Medications have been updated. No AE's/SAE's to report. Next in clinic appointment confirmed.

## 2019-09-28 NOTE — Telephone Encounter (Signed)
Spoke with pt for Clear T11-M27 call. Pt still on drug holiday from all cholesterol medications due to Mylgias involving hips, calves and knees and lower back. Pt will let me know if he restarts IP. Reminded pt of his T12 visit on 12/07/2019 at 0800.

## 2019-09-29 DIAGNOSIS — M5136 Other intervertebral disc degeneration, lumbar region: Secondary | ICD-10-CM | POA: Diagnosis not present

## 2019-09-29 DIAGNOSIS — M4316 Spondylolisthesis, lumbar region: Secondary | ICD-10-CM | POA: Diagnosis not present

## 2019-09-29 DIAGNOSIS — M47817 Spondylosis without myelopathy or radiculopathy, lumbosacral region: Secondary | ICD-10-CM | POA: Diagnosis not present

## 2019-11-17 DIAGNOSIS — S30861A Insect bite (nonvenomous) of abdominal wall, initial encounter: Secondary | ICD-10-CM | POA: Diagnosis not present

## 2019-11-17 DIAGNOSIS — Z6830 Body mass index (BMI) 30.0-30.9, adult: Secondary | ICD-10-CM | POA: Diagnosis not present

## 2019-11-17 DIAGNOSIS — L089 Local infection of the skin and subcutaneous tissue, unspecified: Secondary | ICD-10-CM | POA: Diagnosis not present

## 2019-11-17 DIAGNOSIS — I10 Essential (primary) hypertension: Secondary | ICD-10-CM | POA: Diagnosis not present

## 2019-11-17 DIAGNOSIS — M2550 Pain in unspecified joint: Secondary | ICD-10-CM | POA: Diagnosis not present

## 2019-11-17 DIAGNOSIS — W57XXXA Bitten or stung by nonvenomous insect and other nonvenomous arthropods, initial encounter: Secondary | ICD-10-CM | POA: Diagnosis not present

## 2019-11-17 DIAGNOSIS — T148XXA Other injury of unspecified body region, initial encounter: Secondary | ICD-10-CM | POA: Diagnosis not present

## 2019-12-07 ENCOUNTER — Encounter: Payer: PPO | Admitting: *Deleted

## 2019-12-07 ENCOUNTER — Other Ambulatory Visit: Payer: Self-pay

## 2019-12-07 VITALS — BP 142/77 | HR 55 | Wt 209.6 lb

## 2019-12-07 DIAGNOSIS — Z006 Encounter for examination for normal comparison and control in clinical research program: Secondary | ICD-10-CM

## 2019-12-07 NOTE — Research (Signed)
Subject here for Visit T12, M30 in the CLEAR research study.   Subject was re-consented to [place] Version 8.1 [id number] Local [id number] on 12/07/2019 at 8:15am  Subject Name: Christopher Perez  Subject met inclusion and exclusion criteria.  The informed consent form, study requirements and expectations were reviewed with the subject and questions and concerns were addressed prior to the signing of the consent form.  The subject verbalized understanding of the trial requirements.  The subject agreed to participate in the CLEAR trial and signed the informed consent at [place] on 12/07/19  The informed consent was obtained prior to performance of any protocol-specific procedures for the subject.  A copy of the signed informed consent was given to the subject and a copy was placed in the subject's medical record.   Preston Fleeting C   Concomitant medications reviewed and updated. No AE's/SAE's to report. Patient had 0% compliance and returned all 200 pills from last visit. Patient would like to take a "drug holiday" right now and discuss re-starting the medication further with his PCP. No new medications were dispened at this time. Next appointment scheduled.

## 2020-03-09 ENCOUNTER — Telehealth: Payer: Self-pay

## 2020-03-09 DIAGNOSIS — Z006 Encounter for examination for normal comparison and control in clinical research program: Secondary | ICD-10-CM

## 2020-03-09 NOTE — Telephone Encounter (Signed)
Patient returned call for CLEAR T13 M33. Reviewed con meds and AE/SAEs. Reminded patient of upcoming appointment on 06/06/20.

## 2020-03-09 NOTE — Telephone Encounter (Signed)
Subject was called for CLEAR phone visit T13 M33. VM was left asking for return call.

## 2020-03-14 DIAGNOSIS — J40 Bronchitis, not specified as acute or chronic: Secondary | ICD-10-CM | POA: Diagnosis not present

## 2020-03-14 DIAGNOSIS — J329 Chronic sinusitis, unspecified: Secondary | ICD-10-CM | POA: Diagnosis not present

## 2020-03-14 DIAGNOSIS — I10 Essential (primary) hypertension: Secondary | ICD-10-CM | POA: Diagnosis not present

## 2020-05-10 ENCOUNTER — Telehealth: Payer: Self-pay | Admitting: Pharmacist

## 2020-05-10 NOTE — Telephone Encounter (Signed)
Left message with pt's wife for pt to return call to discuss Leqvio. If pt is interested, he will need to sign form through [facility] pt portal for benefits investigation.

## 2020-05-10 NOTE — Telephone Encounter (Signed)
Pt returned call to clinic, he is interested in [place]. Will mail out form for pt to sign so that we can begin benefits investigation to see if med is affordable.

## 2020-05-31 NOTE — Telephone Encounter (Signed)
Patient mailed Christopher Perez form back with his information.  Filled out Dr. Antionette Char and clinic information and faxed to [phone]

## 2020-06-03 ENCOUNTER — Telehealth: Payer: Self-pay | Admitting: Pharmacist

## 2020-06-03 ENCOUNTER — Telehealth: Payer: Self-pay | Admitting: *Deleted

## 2020-06-03 MED ORDER — REPATHA SURECLICK 140 MG/ML ~~LOC~~ SOAJ: 1.0000 "pen " | SUBCUTANEOUS | 3 refills | Status: DC

## 2020-06-03 NOTE — Telephone Encounter (Signed)
Left voicemail for patient reminding him of his appointment for the CLEAR research study on Monday, February 28th, 2022 at 0800. Please call us back at [phone] to let us know if you'll be able to make your appointment or if you need to reschedule.

## 2020-06-03 NOTE — Telephone Encounter (Signed)
Prior authorization for Repatha approved through 06/03/21, rx sent to pharmacy. Called pt, spoke with his wife, she will pass along message. Will call pt in a week to confirm he started on therapy.

## 2020-06-03 NOTE — Telephone Encounter (Signed)
Leqvio injections cost prohibitive with 20% coinsurance for pt (each injection is about $3,000 base price).  He has multiple medication intolerances (myalgias) with each of the following: - Atorvastatin unknown dose (10-15 years ago) - Rosuvastatin 10mg  daily, 5mg  daily - Vytorin - Livalo 2mg  daily - Pravastatin 40mg  daily (2008) - Ezetimibe 10mg  daily - Praluent 75mg  Q2 weeks (took many injections before stopping due to intolerances per pt) - CLEAR trial investigating bempedoic acid  Discussed that only other option would be rechallenge with other PCSK9i, Repatha. Pt was approved for this last year but he never started on therapy. Spoke with pt and he is now willing to try Repatha. Prior authorization has been submitted.

## 2020-06-06 ENCOUNTER — Other Ambulatory Visit: Payer: Self-pay

## 2020-06-06 ENCOUNTER — Telehealth: Payer: Self-pay | Admitting: Pharmacist

## 2020-06-06 DIAGNOSIS — Z006 Encounter for examination for normal comparison and control in clinical research program: Secondary | ICD-10-CM

## 2020-06-06 NOTE — Research (Signed)
Subject came in for T14 M36. No AE/SAE to report. Con meds reviewed, no changes. Patient will begin either Repatha or Inclisiran, has not yet started. Subject is no longer on IP. Discussed beginning IP again, patient declined due to muscle weakness. Subject stated that while taking IP muscle weakness was intense and he had to lift his leg up into his truck. Subject will continue in study off medication. Next visit scheduled.

## 2020-06-06 NOTE — Telephone Encounter (Signed)
Pt called clinic stating someone from [facility] pt portal called him stating that Leqvio could be free for him. Advised him that the fax we received from their portal stated that he would be responsible for a 20% coinsurance. Sent message to portal to clarify.  Pt already picked up [place] but has not started on injections yet. He does like the idea of giving fewer injections. He will call the portal today to discuss cost again with them.

## 2020-06-15 NOTE — Telephone Encounter (Signed)
I have also submitted a new medical authorization request as the Leqvio portal told pt he may be able to start therapy for free.

## 2020-06-17 NOTE — Telephone Encounter (Signed)
Received confirmation from [place] portal again that coinsurance is 20% which is cost prohibitive. Called pt who was thankful for assistance. He gave 1 Repatha shot and has tolerated it well so far. He will call with any tolerability issues.

## 2020-06-23 NOTE — Progress Notes (Unsigned)
Cardiology Office Note   Date:  06/24/2020   ID:  Christopher Perez 05/10/1945, MRN [id number]  PCP:  Street, Sharon Mt, MD  Cardiologist: Dr. Burt Knack No chief complaint on file.    History of Present Illness: Christopher Perez is a 75 y.o. male who presents for ongoing assessment and management of coronary artery disease, and carotid artery disease..  The patient was seen in 2007 initially with exertional angina, underwent a nuclear scan with high risk features and was found to have critical stenosis of a large obtuse marginal branch of the circumflex.  The patient underwent PCI with a DES.   He was last seen in the office by Dr. Burt Knack on 05/21/2019 and was doing well from a cardiac standpoint without any complaints of chest pain, exertional shortness of breath, dizziness, or fatigue.  He was continued on his current medications, even though he was complaining of some arthralgias and myalgias.  There was discussion about changing his statin therapy but he wished to remain on low-dose rosuvastatin.  He had a history of Raynaud's symptoms which were improved on calcium channel blocker.  Dr. Burt Knack reviewed his last duplex from 2016 revealing less than 40% stenosis bilaterally.  A repeat carotid ultrasound was ordered for surveillance.  Carotid Doppler ultrasound was completed on 06/04/2019 which revealed clear carotids with right subclavian stenosis suggested that arm was not significantly different.  He comes today without any complaints.  He is back to his normal activity, chopping wood, working on machinery, working outside.  He denies any cardiac complaints of chest discomfort, GERD-like symptoms, shortness of breath or fatigue.  He is medically compliant.  Past Medical History:  Diagnosis Date  . Arthritis   . CAD (coronary artery disease)   . GERD (gastroesophageal reflux disease)    OCC  . HLD (hyperlipidemia)   . HTN (hypertension)   . Neuromuscular disorder ([place])    NUMB/  TINGLING TOES  . Stroke [facility])    ? TIA    5-6 YRS AGO    Past Surgical History:  Procedure Laterality Date  . CARDIAC CATHETERIZATION    . CORONARY ANGIOPLASTY     STENT PLACED  03/2006  . FOOT SURGERY     RT FOOT  . KNEE ARTHROSCOPY     RT KNEE ,  MENISCUS REP     Current Outpatient Medications  Medication Sig Dispense Refill  . AMBULATORY NON FORMULARY MEDICATION Take 180 mg by mouth daily. Medication Name: Clear Research Study Bempodic acid versus placebo.    . dicyclomine (BENTYL) 20 MG tablet Take 20 mg by mouth every 4 (four) hours as needed for spasms (STOMACH).     Marland Kitchen diltiazem (CARDIZEM CD) 120 MG 24 hr capsule Take 1 capsule by mouth once daily 90 capsule 2  . Evolocumab (REPATHA SURECLICK) 053 MG/ML SOAJ Inject 1 pen into the skin every 14 (fourteen) days. 6 mL 3  . famotidine (PEPCID) 40 MG tablet Take 1 tablet (40 mg total) by mouth at bedtime. 30 tablet 1  . sildenafil (VIAGRA) 50 MG tablet Take 1 tablet (50 mg total) by mouth daily as needed for erectile dysfunction. 30 tablet 1   No current facility-administered medications for this visit.    Allergies:   Statins and Praluent [alirocumab]    Social History:  The patient  reports that he quit smoking about 37 years ago. His smoking use included cigarettes. He has quit using smokeless tobacco. He reports current alcohol use. He reports that  he does not use drugs.   Family History:  The patient's family history is not on file.    ROS: All other systems are reviewed and negative. Unless otherwise mentioned in H&P    PHYSICAL EXAM: VS:  BP (!) 146/76   Pulse (!) 58   Ht 5\' 10"  (1.778 m)   Wt 214 lb (97.1 kg)   SpO2 98%   BMI 30.71 kg/m  , BMI Body mass index is 30.71 kg/m. GEN: Well nourished, well developed, in no acute distress HEENT: normal Neck: no JVD, soft carotid bruits, or masses Cardiac: RRR; no murmurs, rubs, or gallops,no edema  Respiratory:  Clear to auscultation bilaterally, normal work of  breathing GI: soft, nontender, nondistended, + BS MS: no deformity or atrophy Skin: warm and dry, no rash Neuro:  Strength and sensation are intact Psych: euthymic mood, full affect   EKG: Sinus rhythm/sinus bradycardia heart rate 56 bpm (personally reviewed) Recent Labs: No results found for requested labs within last 8760 hours.    Lipid Panel    Component Value Date/Time   CHOL 221 (H) 05/21/2019 0932   TRIG 162 (H) 05/21/2019 0932   HDL 58 05/21/2019 0932   CHOLHDL 3.8 05/21/2019 0932   CHOLHDL 3.1 11/11/2015 1050   VLDL 36 (H) 11/11/2015 1050   LDLCALC 134 (H) 05/21/2019 0932   LDLDIRECT 188.0 11/09/2014 0734      Wt Readings from Last 3 Encounters:  06/24/20 214 lb (97.1 kg)  12/07/19 209 lb 9.6 oz (95.1 kg)  07/08/19 209 lb 6 oz (95 kg)      Other studies Reviewed:  Carotid artery ultrasound 06/04/2019 Right Carotid: Velocities in the right ICA are consistent with a 1-39%  stenosis.         Non-hemodynamically significant plaque <50% noted in the  CCA.   Left Carotid: Velocities in the left ICA are consistent with a 1-39%  stenosis.        Non-hemodynamically significant plaque <50% noted in the  CCA.   Vertebrals: Bilateral vertebral arteries demonstrate antegrade flow.  Subclavians: Right subclavian artery was stenotic. Normal flow  hemodynamics were        seen in the left subclavian artery.   ASSESSMENT AND PLAN:  1.  Coronary artery disease: History of PCI with DES in 2007.  So far the patient has been doing well and has not had any recurrent angina symptoms.  We reviewed symptoms which would lead him to seek treatment sooner.  Continuation of secondary prevention, active lifestyle, and Mediterranean diet are recommended.  The patient verbalizes understanding.  2.  Carotid artery disease: Most recent carotid artery ultrasound on 06/04/2019 did not show progression of carotid disease.  I did hear a soft carotid bruit on the on  the right.  May need to have follow-up ultrasound in 1 year unless he becomes symptomatic.  3.  Hyperlipidemia: The patient is now on Repatha and is being followed by pharmacy.  Follow-up labs are planned.  Goal of LDL less than 70.  4.  Hypertension: Good control of blood pressure today.  Increased activity, weight loss, and salt avoidance is recommended.  No changes in his medication regimen.   Current medicines are reviewed at length with the patient today.  I have spent 25 mins dedicated to the care of this patient on the date of this encounter to include pre-visit review of records, assessment, management and diagnostic testing,with shared decision making.  Labs/ tests ordered today include: Lipids and LFTS  Phill Myron. West Pugh, ANP, AACC   06/24/2020 4:50 PM    [facility] [place] [address] Office [phone] Fax [phone]  Notice: This dictation was prepared with Dragon dictation along with smaller phrase technology. Any transcriptional errors that result from this process are unintentional and may not be corrected upon review.

## 2020-06-24 ENCOUNTER — Other Ambulatory Visit: Payer: Self-pay

## 2020-06-24 ENCOUNTER — Encounter: Payer: Self-pay | Admitting: Adult Health

## 2020-06-24 ENCOUNTER — Ambulatory Visit: Payer: PPO | Admitting: Adult Health

## 2020-06-24 VITALS — BP 146/76 | HR 58 | Ht 70.0 in | Wt 214.0 lb

## 2020-06-24 DIAGNOSIS — E785 Hyperlipidemia, unspecified: Secondary | ICD-10-CM

## 2020-06-24 DIAGNOSIS — G720 Drug-induced myopathy: Secondary | ICD-10-CM | POA: Diagnosis not present

## 2020-06-24 DIAGNOSIS — I2510 Atherosclerotic heart disease of native coronary artery without angina pectoris: Secondary | ICD-10-CM

## 2020-06-24 DIAGNOSIS — T466X5A Adverse effect of antihyperlipidemic and antiarteriosclerotic drugs, initial encounter: Secondary | ICD-10-CM | POA: Diagnosis not present

## 2020-06-24 NOTE — Patient Instructions (Addendum)
Medication Instructions:  Your physician recommends that you continue on your current medications as directed. Please refer to the Current Medication list given to you today.  *If you need a refill on your cardiac medications before your next appointment, please call your pharmacy*   Lab Work: Come back on Monday, 06/27/2020 FASTING for:  LIPID & LFT   If you have labs (blood work) drawn today and your tests are completely normal, you will receive your results only by: Marland Kitchen MyChart Message (if you have MyChart) OR . A paper copy in the mail If you have any lab test that is abnormal or we need to change your treatment, we will call you to review the results.   Testing/Procedures: None ordered   Follow-Up: At [facility], you and your health needs are our priority.  As part of our continuing mission to provide you with exceptional heart care, we have created designated Provider Care Teams.  These Care Teams include your primary Cardiologist (physician) and Advanced Practice Providers (APPs -  Physician Assistants and Nurse Practitioners) who all work together to provide you with the care you need, when you need it.  We recommend signing up for the patient portal called "MyChart".  Sign up information is provided on this After Visit Summary.  MyChart is used to connect with patients for Virtual Visits (Telemedicine).  Patients are able to view lab/test results, encounter notes, upcoming appointments, etc.  Non-urgent messages can be sent to your provider as well.   To learn more about what you can do with MyChart, go to [email or web address].    Your next appointment:   12 month(s)  The format for your next appointment:   In Person  Provider:   You may see Sherren Mocha, MD or one of the following Advanced Practice Providers on your designated Care Team:    Richardson Dopp, PA-C  Robbie Lis, [place]    Other Instructions

## 2020-06-27 ENCOUNTER — Other Ambulatory Visit: Payer: PPO | Admitting: *Deleted

## 2020-06-27 ENCOUNTER — Other Ambulatory Visit: Payer: Self-pay

## 2020-06-27 DIAGNOSIS — E785 Hyperlipidemia, unspecified: Secondary | ICD-10-CM

## 2020-06-27 LAB — LIPID PANEL
Chol/HDL Ratio: 3.2 ratio (ref 0.0–5.0)
Cholesterol, Total: 165 mg/dL (ref 100–199)
HDL: 52 mg/dL (ref >39)
LDL Chol Calc (NIH): 85 mg/dL (ref 0–99)
Triglycerides: 163 mg/dL — ABNORMAL HIGH (ref 0–149)
VLDL Cholesterol Cal: 28 mg/dL (ref 5–40)

## 2020-06-27 LAB — HEPATIC FUNCTION PANEL
ALT: 14 IU/L (ref 0–44)
AST: 22 IU/L (ref 0–40)
Albumin: 4.5 g/dL (ref 3.7–4.7)
Alkaline Phosphatase: 63 IU/L (ref 44–121)
Bilirubin Total: 0.4 mg/dL (ref 0.0–1.2)
Bilirubin, Direct: 0.11 mg/dL (ref 0.00–0.40)
Total Protein: 7.5 g/dL (ref 6.0–8.5)

## 2020-07-26 ENCOUNTER — Telehealth: Payer: Self-pay | Admitting: Pharmacist

## 2020-07-26 NOTE — Telephone Encounter (Signed)
Pt called clinic reporting muscle and joint pain on Repatha. He has given 3 injections so far and reports noticing pain after each of them. Pain tapers off towards the end of the 2 week period before he's due for his next injection. He is already intolerant to every other option we have (myalgias with all):  - Atorvastatin unknown dose (10-15 years ago) -Rosuvastatin 10mg  daily, 5mg  daily -Vytorin -Livalo 2mg  daily -Pravastatin 40mg  daily (2008) -Ezetimibe10mg  daily - Praluent 75mg  Q2 weeks(took many injections before stopping due to intolerances per pt) - CLEAR trial investigating bempedoic acid  Reports he has his last appt for the CLEAR trial on 6/1, although he stopped study drug a year ago due to myalgias. He does have a history of stroke and would be interested in the [place] 4 trial if he qualifies. Marion Downer is FDA approved but cost is prohibitive for pt. Will forward to research team to screen pt.

## 2020-07-28 DIAGNOSIS — M76891 Other specified enthesopathies of right lower limb, excluding foot: Secondary | ICD-10-CM | POA: Diagnosis not present

## 2020-08-12 DIAGNOSIS — I25119 Atherosclerotic heart disease of native coronary artery with unspecified angina pectoris: Secondary | ICD-10-CM | POA: Diagnosis not present

## 2020-08-12 DIAGNOSIS — N401 Enlarged prostate with lower urinary tract symptoms: Secondary | ICD-10-CM | POA: Diagnosis not present

## 2020-08-12 DIAGNOSIS — Z0000 Encounter for general adult medical examination without abnormal findings: Secondary | ICD-10-CM | POA: Diagnosis not present

## 2020-08-12 DIAGNOSIS — G720 Drug-induced myopathy: Secondary | ICD-10-CM | POA: Diagnosis not present

## 2020-08-12 DIAGNOSIS — R7301 Impaired fasting glucose: Secondary | ICD-10-CM | POA: Diagnosis not present

## 2020-08-12 DIAGNOSIS — M1991 Primary osteoarthritis, unspecified site: Secondary | ICD-10-CM | POA: Diagnosis not present

## 2020-08-12 DIAGNOSIS — K580 Irritable bowel syndrome with diarrhea: Secondary | ICD-10-CM | POA: Diagnosis not present

## 2020-08-12 DIAGNOSIS — T466X5A Adverse effect of antihyperlipidemic and antiarteriosclerotic drugs, initial encounter: Secondary | ICD-10-CM | POA: Diagnosis not present

## 2020-08-12 DIAGNOSIS — I10 Essential (primary) hypertension: Secondary | ICD-10-CM | POA: Diagnosis not present

## 2020-08-12 DIAGNOSIS — Z79899 Other long term (current) drug therapy: Secondary | ICD-10-CM | POA: Diagnosis not present

## 2020-08-12 DIAGNOSIS — E782 Mixed hyperlipidemia: Secondary | ICD-10-CM | POA: Diagnosis not present

## 2020-08-12 DIAGNOSIS — Z125 Encounter for screening for malignant neoplasm of prostate: Secondary | ICD-10-CM | POA: Diagnosis not present

## 2020-09-14 ENCOUNTER — Telehealth: Payer: Self-pay

## 2020-09-14 DIAGNOSIS — Z006 Encounter for examination for normal comparison and control in clinical research program: Secondary | ICD-10-CM

## 2020-09-14 NOTE — Telephone Encounter (Signed)
I called to remind pt of his CLEAR EOS visit on 09/15/2020 at 09:00am. I left a message with pt's instructions, my name and contact number on pt's voicemail.

## 2020-09-15 ENCOUNTER — Encounter: Payer: PPO | Admitting: *Deleted

## 2020-09-15 ENCOUNTER — Other Ambulatory Visit: Payer: Self-pay

## 2020-09-15 VITALS — BP 151/76 | HR 54 | Ht 70.0 in | Wt 215.4 lb

## 2020-09-15 DIAGNOSIS — Z006 Encounter for examination for normal comparison and control in clinical research program: Secondary | ICD-10-CM

## 2020-09-15 NOTE — Research (Signed)
I saw patient for end of study visit for Clear Study. I assessed for medications and adverse events and reconciled. Dr.Stuckey saw patient for physical and EKG was done. Patient is not on study drug due to muscle aches so compliance was not applicable. Reinforced exercise and healthy diet counseling. I reminded patient I would call him in 30 days for last study visit.

## 2020-09-16 ENCOUNTER — Other Ambulatory Visit: Payer: Self-pay | Admitting: Cardiovascular Disease

## 2020-09-27 DIAGNOSIS — Z981 Arthrodesis status: Secondary | ICD-10-CM | POA: Diagnosis not present

## 2020-09-29 NOTE — Addendum Note (Signed)
Addended by: Sandie Ano D on: 09/29/2020 10:08 AM   Modules accepted: Orders

## 2020-10-11 ENCOUNTER — Telehealth: Payer: Self-pay | Admitting: *Deleted

## 2020-10-11 DIAGNOSIS — Z006 Encounter for examination for normal comparison and control in clinical research program: Secondary | ICD-10-CM

## 2020-10-11 NOTE — Telephone Encounter (Signed)
I called patient for 30-day post study visit for Clear Study.I left message for patient to call me back.

## 2020-10-12 ENCOUNTER — Telehealth: Payer: Self-pay | Admitting: *Deleted

## 2020-10-12 NOTE — Telephone Encounter (Signed)
Just completed subject's post end of study phone call for the CLEAR research trial. Subject's concomitant medications have been reviewed and updated in [facility] if applicable. There are no new AE's or SAE's to report to sponsor.

## 2020-10-28 DIAGNOSIS — H43393 Other vitreous opacities, bilateral: Secondary | ICD-10-CM | POA: Diagnosis not present

## 2020-10-28 DIAGNOSIS — H1011 Acute atopic conjunctivitis, right eye: Secondary | ICD-10-CM | POA: Diagnosis not present

## 2020-10-28 DIAGNOSIS — H35362 Drusen (degenerative) of macula, left eye: Secondary | ICD-10-CM | POA: Diagnosis not present

## 2020-10-28 DIAGNOSIS — H04123 Dry eye syndrome of bilateral lacrimal glands: Secondary | ICD-10-CM | POA: Diagnosis not present

## 2020-10-28 DIAGNOSIS — H524 Presbyopia: Secondary | ICD-10-CM | POA: Diagnosis not present

## 2020-10-28 DIAGNOSIS — H26491 Other secondary cataract, right eye: Secondary | ICD-10-CM | POA: Diagnosis not present

## 2020-10-28 DIAGNOSIS — Z961 Presence of intraocular lens: Secondary | ICD-10-CM | POA: Diagnosis not present

## 2020-11-22 DIAGNOSIS — Z6830 Body mass index (BMI) 30.0-30.9, adult: Secondary | ICD-10-CM | POA: Diagnosis not present

## 2020-11-22 DIAGNOSIS — J329 Chronic sinusitis, unspecified: Secondary | ICD-10-CM | POA: Diagnosis not present

## 2020-12-27 DIAGNOSIS — J329 Chronic sinusitis, unspecified: Secondary | ICD-10-CM | POA: Diagnosis not present

## 2020-12-27 DIAGNOSIS — Z6830 Body mass index (BMI) 30.0-30.9, adult: Secondary | ICD-10-CM | POA: Diagnosis not present

## 2020-12-27 DIAGNOSIS — J40 Bronchitis, not specified as acute or chronic: Secondary | ICD-10-CM | POA: Diagnosis not present

## 2020-12-27 DIAGNOSIS — U071 COVID-19: Secondary | ICD-10-CM | POA: Diagnosis not present

## 2020-12-27 DIAGNOSIS — Z20828 Contact with and (suspected) exposure to other viral communicable diseases: Secondary | ICD-10-CM | POA: Diagnosis not present

## 2020-12-29 DIAGNOSIS — Z006 Encounter for examination for normal comparison and control in clinical research program: Secondary | ICD-10-CM

## 2020-12-29 NOTE — Research (Signed)
Called patient and discussed the ORION 4 trial. He is interested but is positive for COVID and would like a call back after Sept 27,2022

## 2021-01-23 DIAGNOSIS — J40 Bronchitis, not specified as acute or chronic: Secondary | ICD-10-CM | POA: Diagnosis not present

## 2021-01-23 DIAGNOSIS — J329 Chronic sinusitis, unspecified: Secondary | ICD-10-CM | POA: Diagnosis not present

## 2021-02-05 ENCOUNTER — Encounter (HOSPITAL_COMMUNITY): Payer: Self-pay | Admitting: *Deleted

## 2021-02-05 ENCOUNTER — Ambulatory Visit (HOSPITAL_COMMUNITY)
Admission: EM | Admit: 2021-02-05 | Discharge: 2021-02-05 | Disposition: A | Discharge status: Home / Self Care | Payer: PPO | Attending: Student | Admitting: Student

## 2021-02-05 ENCOUNTER — Other Ambulatory Visit: Payer: Self-pay

## 2021-02-05 DIAGNOSIS — J205 Acute bronchitis due to respiratory syncytial virus: Secondary | ICD-10-CM | POA: Diagnosis not present

## 2021-02-05 DIAGNOSIS — Z20828 Contact with and (suspected) exposure to other viral communicable diseases: Secondary | ICD-10-CM

## 2021-02-05 DIAGNOSIS — I10 Essential (primary) hypertension: Secondary | ICD-10-CM | POA: Diagnosis not present

## 2021-02-05 DIAGNOSIS — Z20822 Contact with and (suspected) exposure to covid-19: Secondary | ICD-10-CM | POA: Diagnosis not present

## 2021-02-05 LAB — POC INFLUENZA A AND B ANTIGEN (URGENT CARE ONLY)
INFLUENZA A ANTIGEN, POC: NEGATIVE
INFLUENZA B ANTIGEN, POC: NEGATIVE

## 2021-02-05 MED ORDER — ALBUTEROL SULFATE HFA 108 (90 BASE) MCG/ACT IN AERS: 1.0000 | INHALATION_SPRAY | Freq: Four times a day (QID) | RESPIRATORY_TRACT | 0 refills | Status: DC | PRN

## 2021-02-05 MED ORDER — PREDNISONE 20 MG PO TABS: 40.0000 mg | ORAL_TABLET | Freq: Every day | ORAL | 0 refills | Status: AC

## 2021-02-05 MED ORDER — OSELTAMIVIR PHOSPHATE 75 MG PO CAPS: 75.0000 mg | ORAL_CAPSULE | Freq: Every day | ORAL | 0 refills | Status: AC

## 2021-02-05 NOTE — ED Provider Notes (Addendum)
[place]    CSN: [id number] Arrival date & time: 02/05/21  1257      History   Chief Complaint Chief Complaint  Patient presents with   Cough   Sore Throat   Muscle Pain    HPI Christopher Perez is a 75 y.o. male presenting with lingering cough following suspected RSV. Medical history covid one month ago.  States he has had symptoms currently for about 2 weeks, slowly improving.  Describes nonproductive cough, lingering congestion, sore throat from the coughing.  States his wife is much more sick still.  States that his primary care provider prescribed him amoxicillin and hydrocodone cough syrup which did seem to improve the symptoms.  Denies shortness of breath, chest pain, weakness, fever/chills.  States that he is back to doing yard work.  HPI  Past Medical History:  Diagnosis Date   Arthritis    CAD (coronary artery disease)    GERD (gastroesophageal reflux disease)    OCC   HLD (hyperlipidemia)    HTN (hypertension)    Neuromuscular disorder (HCC)    NUMB/ TINGLING TOES   Stroke ([place])    ? TIA    5-6 YRS AGO    Patient Active Problem List   Diagnosis Date Noted   Rectal bleeding 07/08/2019   History of colonic polyps 07/08/2019   Statin myopathy 07/07/2019   Spondylolisthesis of lumbar region 07/06/2015   Preoperative clearance 06/11/2012   Erectile dysfunction 10/03/2010   CAROTID ARTERY STENOSIS, WITHOUT INFARCTION 10/02/2009   Dyslipidemia 07/24/2008   Essential hypertension 07/24/2008   CAD 07/24/2008    Past Surgical History:  Procedure Laterality Date   CARDIAC CATHETERIZATION     CORONARY ANGIOPLASTY     STENT PLACED  03/2006   FOOT SURGERY     RT FOOT   KNEE ARTHROSCOPY     RT KNEE ,  MENISCUS REP       Home Medications    Prior to Admission medications   Medication Sig Start Date End Date Taking? Authorizing Provider  albuterol (VENTOLIN HFA) 108 (90 Base) MCG/ACT inhaler Inhale 1-2 puffs into the lungs every 6 (six)  hours as needed for wheezing or shortness of breath. 02/05/21  Yes Hazel Sams, PA-C  oseltamivir (TAMIFLU) 75 MG capsule Take 1 capsule (75 mg total) by mouth daily for 10 days. 02/05/21 02/15/21 Yes Hazel Sams, PA-C  predniSONE (DELTASONE) 20 MG tablet Take 2 tablets (40 mg total) by mouth daily for 5 days. Take with breakfast or lunch. Avoid NSAIDs (ibuprofen, etc) while taking this medication. 02/05/21 02/10/21 Yes Hazel Sams, PA-C  dicyclomine (BENTYL) 20 MG tablet Take 20 mg by mouth every 4 (four) hours as needed for spasms (STOMACH).  08/27/14   [provider]  diltiazem (CARDIZEM CD) 120 MG 24 hr capsule Take 1 capsule by mouth once daily 09/16/20   Sherren Mocha, MD  famotidine (PEPCID) 40 MG tablet Take 1 tablet (40 mg total) by mouth at bedtime. 07/08/19   Noralyn Pick, NP  sildenafil (VIAGRA) 50 MG tablet Take 1 tablet (50 mg total) by mouth daily as needed for erectile dysfunction. 05/21/19   Sherren Mocha, MD    Family History Family History  Problem Relation Age of Onset   Heart attack Neg Hx    Stroke Neg Hx    Hypertension Neg Hx    Colon cancer Neg Hx    Esophageal cancer Neg Hx    Pancreatic cancer Neg Hx  Stomach cancer Neg Hx     Social History Social History   Tobacco Use   Smoking status: Former    Types: Cigarettes    Quit date: 04/10/1983    Years since quitting: 37.8   Smokeless tobacco: Former  Scientific laboratory technician Use: Never used  Substance Use Topics   Alcohol use: Yes    Comment: OCC   Drug use: No     Allergies   Statins, Praluent [alirocumab], and Repatha [evolocumab]   Review of Systems Review of Systems  Constitutional:  Negative for appetite change, chills and fever.  HENT:  Positive for congestion. Negative for ear pain, rhinorrhea, sinus pressure, sinus pain and sore throat.   Eyes:  Negative for redness and visual disturbance.  Respiratory:  Positive for cough. Negative for chest tightness,  shortness of breath and wheezing.   Cardiovascular:  Negative for chest pain and palpitations.  Gastrointestinal:  Negative for abdominal pain, constipation, diarrhea, nausea and vomiting.  Genitourinary:  Negative for dysuria, frequency and urgency.  Musculoskeletal:  Negative for myalgias.  Neurological:  Negative for dizziness, weakness and headaches.  Psychiatric/Behavioral:  Negative for confusion.   All other systems reviewed and are negative.   Physical Exam Triage Vital Signs ED Triage Vitals  Enc Vitals Group     BP 02/05/21 1415 (!) 183/92     Pulse Rate 02/05/21 1415 66     Resp 02/05/21 1415 20     Temp 02/05/21 1415 98.3 F (36.8 C)     Temp src --      SpO2 02/05/21 1415 95 %     Weight --      Height --      Head Circumference --      Peak Flow --      Pain Score 02/05/21 1416 0     Pain Loc --      Pain Edu? --      Excl. in [place]? --    No data found.  Updated Vital Signs BP (!) 183/92   Pulse 66   Temp 98.3 F (36.8 C)   Resp 20   SpO2 95%   Visual Acuity Right Eye Distance:   Left Eye Distance:   Bilateral Distance:    Right Eye Near:   Left Eye Near:    Bilateral Near:     Physical Exam Vitals reviewed.  Constitutional:      General: He is not in acute distress.    Appearance: Normal appearance. He is not ill-appearing.  HENT:     Head: Normocephalic and atraumatic.     Right Ear: Tympanic membrane, ear canal and external ear normal. No tenderness. No middle ear effusion. There is no impacted cerumen. Tympanic membrane is not perforated, erythematous, retracted or bulging.     Left Ear: Tympanic membrane, ear canal and external ear normal. No tenderness.  No middle ear effusion. There is no impacted cerumen. Tympanic membrane is not perforated, erythematous, retracted or bulging.     Nose: Nose normal. No congestion.     Mouth/Throat:     Mouth: Mucous membranes are moist.     Pharynx: Uvula midline. No oropharyngeal exudate or posterior  oropharyngeal erythema.  Eyes:     Extraocular Movements: Extraocular movements intact.     Pupils: Pupils are equal, round, and reactive to light.  Cardiovascular:     Rate and Rhythm: Normal rate and regular rhythm.     Heart sounds: Normal heart sounds.  Pulmonary:     Effort: Pulmonary effort is normal.     Breath sounds: Normal breath sounds. No decreased breath sounds, wheezing, rhonchi or rales.     Comments: Occ cough Abdominal:     Palpations: Abdomen is soft.     Tenderness: There is no abdominal tenderness. There is no guarding or rebound.  Neurological:     General: No focal deficit present.     Mental Status: He is alert and oriented to person, place, and time.  Psychiatric:        Mood and Affect: Mood normal.        Behavior: Behavior normal.        Thought Content: Thought content normal.        Judgment: Judgment normal.     UC Treatments / Results  Labs (all labs ordered are listed, but only abnormal results are displayed) Labs Reviewed  POC INFLUENZA A AND B ANTIGEN (URGENT CARE ONLY)    EKG   Radiology No results found.  Procedures Procedures (including critical care time)  Medications Ordered in UC Medications - No data to display  Initial Impression / Assessment and Plan / UC Course  I have reviewed the triage vital signs and the nursing notes.  Pertinent labs & imaging results that were available during my care of the patient were reviewed by me and considered in my medical decision making (see chart for details).     This patient is a very pleasant 75 y.o. year old male presenting with bronchitis following RSV. Also with exposure to influenza - wife. Today this pt is afebrile nontachycardic nontachypneic, oxygenating well on room air, no wheezes rhonchi or rales. This patient had covid-19 one month ago.  Exposure to RSV 2 weeks ago. Has already completed amoxicillin and hydrocodone cough syrup as prescribed by PCP. Symptoms are improving.    Rapid influenza negative, but exposure to influenza- wife. Will send tamiflu preventative dose.   Will also manage with albuterol inhaler and low-dose prednisone as below. Denies history pulm ds, but did have covid-19 one month ago.   For hypertension- monitor this at home, continue current regimen.  ED return precautions discussed. Patient verbalizes understanding and agreement.    Final Clinical Impressions(s) / UC Diagnoses   Final diagnoses:  Acute bronchitis due to respiratory syncytial virus (RSV)  Essential hypertension  Exposure to influenza     Discharge Instructions      -Albuterol inhaler as needed for cough, wheezing, shortness of breath, 1 to 2 puffs every 6 hours as needed. -Prednisone, 2 pills taken at the same time for 5 days in a row.  Try taking this earlier in the day as it can give you energy. Avoid NSAIDs like ibuprofen and alleve while taking this medication as they can increase your risk of stomach upset and even GI bleeding when in combination with a steroid. You can continue tylenol (acetaminophen) up to 1000mg  3x daily. -Follow-up if symptoms getting worse instead of better: cough, fevers/chills, chest pain, shortness of breath -Please check your blood pressure at home or at the pharmacy. If this continues to be >140/90, follow-up with your primary care provider for further blood pressure management/ medication titration. If you develop chest pain, shortness of breath, vision changes, the worst headache of your life- head straight to the ED or call 911.      ED Prescriptions     Medication Sig Dispense Auth. Provider   predniSONE (DELTASONE) 20 MG tablet Take 2 tablets (  40 mg total) by mouth daily for 5 days. Take with breakfast or lunch. Avoid NSAIDs (ibuprofen, etc) while taking this medication. 10 tablet Hazel Sams, PA-C   albuterol (VENTOLIN HFA) 108 (90 Base) MCG/ACT inhaler Inhale 1-2 puffs into the lungs every 6 (six) hours as needed for  wheezing or shortness of breath. 1 each Hazel Sams, PA-C   oseltamivir (TAMIFLU) 75 MG capsule Take 1 capsule (75 mg total) by mouth daily for 10 days. 10 capsule Hazel Sams, PA-C      PDMP not reviewed this encounter.   Hazel Sams, PA-C 02/05/21 1438    Hazel Sams, PA-C 02/05/21 1441    Hazel Sams, PA-C 02/05/21 1443    Hazel Sams, PA-C 02/05/21 [id number]

## 2021-02-05 NOTE — Discharge Instructions (Addendum)
-  Albuterol inhaler as needed for cough, wheezing, shortness of breath, 1 to 2 puffs every 6 hours as needed. -Prednisone, 2 pills taken at the same time for 5 days in a row.  Try taking this earlier in the day as it can give you energy. Avoid NSAIDs like ibuprofen and alleve while taking this medication as they can increase your risk of stomach upset and even GI bleeding when in combination with a steroid. You can continue tylenol (acetaminophen) up to 1000mg  3x daily. -Follow-up if symptoms getting worse instead of better: cough, fevers/chills, chest pain, shortness of breath -Please check your blood pressure at home or at the pharmacy. If this continues to be >140/90, follow-up with your primary care provider for further blood pressure management/ medication titration. If you develop chest pain, shortness of breath, vision changes, the worst headache of your life- head straight to the ED or call 911.

## 2021-02-05 NOTE — ED Triage Notes (Signed)
Pt reports his Great granddaughter was Positive for RSV 2 weeks ago. Pt has cough and  sore throat.

## 2021-02-07 DIAGNOSIS — H26492 Other secondary cataract, left eye: Secondary | ICD-10-CM | POA: Diagnosis not present

## 2021-02-16 DIAGNOSIS — Z23 Encounter for immunization: Secondary | ICD-10-CM | POA: Diagnosis not present

## 2021-03-28 DIAGNOSIS — M19011 Primary osteoarthritis, right shoulder: Secondary | ICD-10-CM | POA: Diagnosis not present

## 2021-03-30 ENCOUNTER — Telehealth: Payer: Self-pay

## 2021-03-30 NOTE — Telephone Encounter (Signed)
Called patient to discuss ORION 4 trial with no answer. Message left asking for a return call. Will try again at a later date.

## 2021-04-13 ENCOUNTER — Telehealth: Payer: Self-pay

## 2021-04-13 NOTE — Telephone Encounter (Signed)
Patient called to discuss ORION 4 study and left a message. Tried to return his call. Called both numbers and message was left.

## 2021-07-11 ENCOUNTER — Encounter: Payer: Self-pay | Admitting: Cardiovascular Disease

## 2021-07-11 ENCOUNTER — Ambulatory Visit: Payer: PPO | Admitting: Cardiovascular Disease

## 2021-07-11 VITALS — BP 144/80 | HR 64 | Ht 70.0 in | Wt 216.8 lb

## 2021-07-11 DIAGNOSIS — R0989 Other specified symptoms and signs involving the circulatory and respiratory systems: Secondary | ICD-10-CM | POA: Diagnosis not present

## 2021-07-11 DIAGNOSIS — I10 Essential (primary) hypertension: Secondary | ICD-10-CM | POA: Diagnosis not present

## 2021-07-11 DIAGNOSIS — E782 Mixed hyperlipidemia: Secondary | ICD-10-CM

## 2021-07-11 DIAGNOSIS — I25118 Atherosclerotic heart disease of native coronary artery with other forms of angina pectoris: Secondary | ICD-10-CM | POA: Diagnosis not present

## 2021-07-11 LAB — COMPREHENSIVE METABOLIC PANEL
ALT: 24 IU/L (ref 0–44)
AST: 22 IU/L (ref 0–40)
Albumin/Globulin Ratio: 1.7 (ref 1.2–2.2)
Albumin: 4.5 g/dL (ref 3.7–4.7)
Alkaline Phosphatase: 67 IU/L (ref 44–121)
BUN/Creatinine Ratio: 16 (ref 10–24)
BUN: 13 mg/dL (ref 8–27)
Bilirubin Total: 0.4 mg/dL (ref 0.0–1.2)
CO2: 25 mmol/L (ref 20–29)
Calcium: 9.7 mg/dL (ref 8.6–10.2)
Chloride: 100 mmol/L (ref 96–106)
Creatinine, Ser: 0.8 mg/dL (ref 0.76–1.27)
Globulin, Total: 2.7 g/dL (ref 1.5–4.5)
Glucose: 110 mg/dL — ABNORMAL HIGH (ref 70–99)
Potassium: 4.3 mmol/L (ref 3.5–5.2)
Sodium: 137 mmol/L (ref 134–144)
Total Protein: 7.2 g/dL (ref 6.0–8.5)
eGFR: 92 mL/min/{1.73_m2} (ref >59)

## 2021-07-11 LAB — CBC
Hematocrit: 44.1 % (ref 37.5–51.0)
Hemoglobin: 14.6 g/dL (ref 13.0–17.7)
MCH: 28.2 pg (ref 26.6–33.0)
MCHC: 33.1 g/dL (ref 31.5–35.7)
MCV: 85 fL (ref 79–97)
Platelets: 305 10*3/uL (ref 150–450)
RBC: 5.18 x10E6/uL (ref 4.14–5.80)
RDW: 13.6 % (ref 11.6–15.4)
WBC: 7 10*3/uL (ref 3.4–10.8)

## 2021-07-11 LAB — LIPID PANEL
Chol/HDL Ratio: 6.1 ratio — ABNORMAL HIGH (ref 0.0–5.0)
Cholesterol, Total: 306 mg/dL — ABNORMAL HIGH (ref 100–199)
HDL: 50 mg/dL (ref >39)
LDL Chol Calc (NIH): 218 mg/dL — ABNORMAL HIGH (ref 0–99)
Triglycerides: 194 mg/dL — ABNORMAL HIGH (ref 0–149)
VLDL Cholesterol Cal: 38 mg/dL (ref 5–40)

## 2021-07-11 MED ORDER — ASPIRIN EC 81 MG PO TBEC
81.0000 mg | DELAYED_RELEASE_TABLET | Freq: Every day | ORAL | 3 refills | Status: AC
Start: 2021-07-11 — End: ?

## 2021-07-11 NOTE — Progress Notes (Signed)
?Cardiology Office Note:   ? ?Date:  07/11/2021  ? ?ID:  Christopher Perez, DOB Aug 07, 1945, MRN [id number] ? ?PCP:  Street, Sharon Mt, MD ?  ?[facility] Providers ?Cardiologist:  Sherren Mocha, MD    ? ?Referring MD: Street, Sharon Mt, *  ? ?Chief Complaint  ?Patient presents with  ? Coronary Artery Disease  ? ? ?History of Present Illness:   ? ?Christopher Perez is a 76 y.o. male with a hx of coronary artery disease,  hypertension, mixed hyperlipidemia, presenting for follow-up evaluation.  The patient initially presented in 2007 with exertional angina, was found to have high risk features on a nuclear stress test, and underwent PCI to treat critical stenosis in a large OM branch of the circumflex.  He was last seen about 1 year ago in our office by Jory Sims, NP.  At the time of his last visit he was felt to be doing well. ? ?The patient is here alone today. He had Covid in September 2022 then had RSV, reports a fairly prolonged course. He primarily complains of joint pains and fatigue. His fingers and toes all hurt. He has some numbness in his feet and has experienced this since undergoing back surgery. He has occasional chest discomfort in the center/left chest but no symptoms with physical exertion. No palpitations, orthopnea, PND, or edema. Progressive fatigue is his primary complaint today.  ? ?Past Medical History:  ?Diagnosis Date  ? Arthritis   ? CAD (coronary artery disease)   ? GERD (gastroesophageal reflux disease)   ? OCC  ? HLD (hyperlipidemia)   ? HTN (hypertension)   ? Neuromuscular disorder ([place])   ? NUMB/ TINGLING TOES  ? Stroke [facility])   ? ? TIA    5-6 YRS AGO  ? ? ?Past Surgical History:  ?Procedure Laterality Date  ? CARDIAC CATHETERIZATION    ? CORONARY ANGIOPLASTY    ? STENT PLACED  03/2006  ? FOOT SURGERY    ? RT FOOT  ? KNEE ARTHROSCOPY    ? RT KNEE ,  MENISCUS REP  ? ? ?Current Medications: ?Current Meds  ?Medication Sig  ? aspirin EC 81 MG tablet Take 1 tablet (81 mg  total) by mouth daily. Swallow whole.  ? dicyclomine (BENTYL) 20 MG tablet Take 20 mg by mouth every 4 (four) hours as needed for spasms (STOMACH).   ? diltiazem (CARDIZEM CD) 120 MG 24 hr capsule Take 1 capsule by mouth once daily  ? famotidine (PEPCID) 40 MG tablet Take 1 tablet (40 mg total) by mouth at bedtime. (Patient taking differently: Take 40 mg by mouth as needed.)  ? magnesium gluconate (MAGONATE) 500 MG tablet Take 500 mg by mouth daily. Per patient taking 400 mg daily  ? sildenafil (VIAGRA) 50 MG tablet Take 1 tablet (50 mg total) by mouth daily as needed for erectile dysfunction.  ? vitamin C (ASCORBIC ACID) 500 MG tablet Take 500 mg by mouth daily.  ? zinc gluconate 50 MG tablet Take 50 mg by mouth daily.  ?  ? ?Allergies:   Statins, Praluent [alirocumab], and Repatha [evolocumab]  ? ?Social History  ? ?Socioeconomic History  ? Marital status: Married  ?  Spouse name: Not on file  ? Number of children: 1  ? Years of education: Not on file  ? Highest education level: Not on file  ?Occupational History  ? Occupation: Information systems manager  ?Tobacco Use  ? Smoking status: Former  ?  Types: Cigarettes  ?  Quit date: 04/10/1983  ?  Years since quitting: 38.2  ? Smokeless tobacco: Former  ?Vaping Use  ? Vaping Use: Never used  ?Substance and Sexual Activity  ? Alcohol use: Yes  ?  Comment: OCC  ? Drug use: No  ? Sexual activity: Not on file  ?Other Topics Concern  ? Not on file  ?Social History Narrative  ? Not on file  ? ?Social Determinants of Health  ? ?Financial Resource Strain: Not on file  ?Food Insecurity: Not on file  ?Transportation Needs: Not on file  ?Physical Activity: Not on file  ?Stress: Not on file  ?Social Connections: Not on file  ?  ? ?Family History: ?The patient's family history is negative for Heart attack, Stroke, Hypertension, Colon cancer, Esophageal cancer, Pancreatic cancer, and Stomach cancer. ? ?ROS:   ?Please see the history of present illness.    ?All other systems  reviewed and are negative. ? ?EKGs/Labs/Other Studies Reviewed:   ? ?The following studies were reviewed today: ?Carotid US 06/04/2019: ?Summary:  ?Right Carotid: Velocities in the right ICA are consistent with a 1-39%  ?stenosis.  ?               Non-hemodynamically significant plaque <50% noted in the  ?CCA.  ? ?Left Carotid: Velocities in the left ICA are consistent with a 1-39%  ?stenosis.  ?              Non-hemodynamically significant plaque <50% noted in the  ?CCA.  ? ?Vertebrals:  Bilateral vertebral arteries demonstrate antegrade flow.  ?Subclavians: Right subclavian artery was stenotic. Normal flow  ?hemodynamics were  ?             seen in the left subclavian artery.  ? ?EKG:  EKG is ordered today.  The ekg ordered today demonstrates NSR 64 bpm, nonspecific ST abnormality. No change from last tracing 06/24/20. ? ?Recent Labs: ?No results found for requested labs within last 8760 hours.  ?Recent Lipid Panel ?   ?Component Value Date/Time  ? CHOL 165 06/27/2020 0735  ? TRIG 163 (H) 06/27/2020 0735  ? HDL 52 06/27/2020 0735  ? CHOLHDL 3.2 06/27/2020 0735  ? CHOLHDL 3.1 11/11/2015 1050  ? VLDL 36 (H) 11/11/2015 1050  ? [place] 85 06/27/2020 0735  ? LDLDIRECT 188.0 11/09/2014 0734  ? ? ? ?Risk Assessment/Calculations:   ?  ? ?    ? ?Physical Exam:   ? ?VS:  BP (!) 144/80   Pulse 64   Ht '5\' 10"'$  (1.778 m)   Wt 216 lb 12.8 oz (98.3 kg)   SpO2 94%   BMI 31.11 kg/m?    ? ?Wt Readings from Last 3 Encounters:  ?07/11/21 216 lb 12.8 oz (98.3 kg)  ?09/15/20 215 lb 6.4 oz (97.7 kg)  ?06/24/20 214 lb (97.1 kg)  ?  ? ?GEN:  Well nourished, well developed in no acute distress ?HEENT: Normal ?NECK: No JVD; Left carotid bruit ?LYMPHATICS: No lymphadenopathy ?CARDIAC: RRR, no murmurs, rubs, gallops ?RESPIRATORY:  Clear to auscultation without rales, wheezing or rhonchi  ?ABDOMEN: Soft, non-tender, non-distended ?MUSCULOSKELETAL:  No edema; No deformity  ?SKIN: Warm and dry ?NEUROLOGIC:  Alert and oriented x 3 ?PSYCHIATRIC:   Normal affect  ? ?ASSESSMENT:   ? ?1. Coronary artery disease involving native coronary artery of native heart with other form of angina pectoris ([place])   ?2. Mixed hyperlipidemia   ?3. Essential hypertension   ?4. Left carotid bruit   ? ?PLAN:   ? ?  In order of problems listed above: ? ?Patient with chest discomfort, typical and atypical features, also associated with worsening generalized fatigue.  With his known CAD, remote MI, and no functional testing in many years, I have recommended a Lexiscan Myoview stress test for further risk stratification.  We will continue on aspirin for antiplatelet therapy. ?Unfortunately he has been intolerant to all statin drugs, Praluent, and Repatha.  We will check fasting lipids this morning.  Consider a trial of Zetia.  Await results. ?Treated with diltiazem 120 mg daily. ?Last carotid ultrasound reviewed with no significant ICA stenosis.  This was done in 2021.  With the finding of a left carotid bruit, we will update his carotid duplex scan. ? ?   ? ?Shared Decision Making/Informed Consent ?The risks [chest pain, shortness of breath, cardiac arrhythmias, dizziness, blood pressure fluctuations, myocardial infarction, stroke/transient ischemic attack, nausea, vomiting, allergic reaction, radiation exposure, metallic taste sensation and life-threatening complications (estimated to be 1 in 10,000)], benefits (risk stratification, diagnosing coronary artery disease, treatment guidance) and alternatives of a nuclear stress test were discussed in detail with Mr. Grandville Silos and he agrees to proceed.  ? ? ?Medication Adjustments/Labs and Tests Ordered: ?Current medicines are reviewed at length with the patient today.  Concerns regarding medicines are outlined above.  ?Orders Placed This Encounter  ?Procedures  ? CBC  ? Comprehensive metabolic panel  ? Lipid panel  ? Cardiac Stress Test: Informed Consent Details: Physician/Practitioner Attestation; Transcribe to consent form and obtain  patient signature  ? MYOCARDIAL PERFUSION IMAGING  ? EKG 12-Lead  ? VAS US CAROTID  ? ?Meds ordered this encounter  ?Medications  ? aspirin EC 81 MG tablet  ?  Sig: Take 1 tablet (81 mg total) by mouth daily. [place]

## 2021-07-11 NOTE — Patient Instructions (Signed)
Medication Instructions:  ?CONTINUE Aspirin '81mg'$  daily ?*If you need a refill on your cardiac medications before your next appointment, please call your pharmacy* ? ? ?Lab Work: ?CBC, CMET, Lipids TODAY ?If you have labs (blood work) drawn today and your tests are completely normal, you will receive your results only by: ?MyChart Message (if you have MyChart) OR ?A paper copy in the mail ?If you have any lab test that is abnormal or we need to change your treatment, we will call you to review the results. ? ? ?Testing/Procedures: ?Lexiscan Myoview stress test ?Your physician has requested that you have a lexiscan myoview. For further information please visit [email or web address]. Please follow instruction sheet, as given. ? ?Carotid Ultrasound ?Your physician has requested that you have a carotid duplex. This test is an ultrasound of the carotid arteries in your neck. It looks at blood flow through these arteries that supply the brain with blood. Allow one hour for this exam. There are no restrictions or special instructions. ? ? ? ?Follow-Up: ?At [facility], you and your health needs are our priority.  As part of our continuing mission to provide you with exceptional heart care, we have created designated Provider Care Teams.  These Care Teams include your primary Cardiologist (physician) and Advanced Practice Providers (APPs -  Physician Assistants and Nurse Practitioners) who all work together to provide you with the care you need, when you need it. ? ? ?Your next appointment:   ?1 year(s) ? ?The format for your next appointment:   ?In Person ? ?Provider:   ?Sherren Mocha, MD   ? ? ?Other Instructions ?See separate instructions for stress test  ?  ?

## 2021-07-12 ENCOUNTER — Telehealth (HOSPITAL_COMMUNITY): Payer: Self-pay | Admitting: *Deleted

## 2021-07-12 NOTE — Telephone Encounter (Signed)
Patient given detailed instructions per Myocardial Perfusion Study Information Sheet for the test on  07/18/21 Patient notified to arrive 15 minutes early and that it is imperative to arrive on time for appointment to keep from having the test rescheduled. ? If you need to cancel or reschedule your appointment, please call the office within 24 hours of your appointment. . Patient verbalized understanding.  Christopher Perez ? ? ? ?

## 2021-07-13 ENCOUNTER — Telehealth: Payer: Self-pay

## 2021-07-13 MED ORDER — EZETIMIBE 10 MG PO TABS: 10.0000 mg | ORAL_TABLET | Freq: Every day | ORAL | 3 refills | Status: DC

## 2021-07-13 NOTE — Telephone Encounter (Signed)
-----   Message from Sherren Mocha, MD sent at 07/11/2021  5:51 PM EDT ----- ?Labs reviewed.  Cholesterol is really high.  See my office note he cannot take most lipid-lowering drugs and has failed statins and PCSK9 inhibitors.  Please add Zetia 10 mg daily to his regimen.  Thank you ?

## 2021-07-13 NOTE — Telephone Encounter (Signed)
Called and reviewed with patient. Zetia sent into pharmacy on file. ?

## 2021-07-14 ENCOUNTER — Ambulatory Visit (HOSPITAL_BASED_OUTPATIENT_CLINIC_OR_DEPARTMENT_OTHER)
Admission: RE | Admit: 2021-07-14 | Discharge: 2021-07-14 | Disposition: A | Discharge status: Home / Self Care | Payer: PPO | Source: Ambulatory Visit | Attending: Cardiology | Admitting: Cardiology

## 2021-07-14 ENCOUNTER — Ambulatory Visit
Admission: EM | Admit: 2021-07-14 | Discharge: 2021-07-14 | Disposition: A | Discharge status: Home / Self Care | Payer: PPO | Attending: Emergency Medicine | Admitting: Emergency Medicine

## 2021-07-14 ENCOUNTER — Other Ambulatory Visit: Payer: Self-pay

## 2021-07-14 ENCOUNTER — Encounter (HOSPITAL_BASED_OUTPATIENT_CLINIC_OR_DEPARTMENT_OTHER): Payer: Self-pay | Admitting: Emergency Medicine

## 2021-07-14 ENCOUNTER — Emergency Department (HOSPITAL_BASED_OUTPATIENT_CLINIC_OR_DEPARTMENT_OTHER): Payer: PPO

## 2021-07-14 ENCOUNTER — Emergency Department (HOSPITAL_BASED_OUTPATIENT_CLINIC_OR_DEPARTMENT_OTHER)
Admission: EM | Admit: 2021-07-14 | Discharge: 2021-07-14 | Disposition: A | Discharge status: Home / Self Care | Payer: PPO | Attending: Emergency Medicine | Admitting: Emergency Medicine

## 2021-07-14 DIAGNOSIS — J069 Acute upper respiratory infection, unspecified: Secondary | ICD-10-CM

## 2021-07-14 DIAGNOSIS — Z20828 Contact with and (suspected) exposure to other viral communicable diseases: Secondary | ICD-10-CM

## 2021-07-14 DIAGNOSIS — D72829 Elevated white blood cell count, unspecified: Secondary | ICD-10-CM | POA: Diagnosis not present

## 2021-07-14 DIAGNOSIS — R0989 Other specified symptoms and signs involving the circulatory and respiratory systems: Secondary | ICD-10-CM

## 2021-07-14 DIAGNOSIS — R062 Wheezing: Secondary | ICD-10-CM

## 2021-07-14 DIAGNOSIS — R051 Acute cough: Secondary | ICD-10-CM

## 2021-07-14 DIAGNOSIS — Z7982 Long term (current) use of aspirin: Secondary | ICD-10-CM | POA: Diagnosis not present

## 2021-07-14 DIAGNOSIS — E871 Hypo-osmolality and hyponatremia: Secondary | ICD-10-CM | POA: Diagnosis not present

## 2021-07-14 DIAGNOSIS — I25118 Atherosclerotic heart disease of native coronary artery with other forms of angina pectoris: Secondary | ICD-10-CM | POA: Diagnosis not present

## 2021-07-14 DIAGNOSIS — J988 Other specified respiratory disorders: Secondary | ICD-10-CM

## 2021-07-14 DIAGNOSIS — I2510 Atherosclerotic heart disease of native coronary artery without angina pectoris: Secondary | ICD-10-CM | POA: Diagnosis not present

## 2021-07-14 DIAGNOSIS — R0602 Shortness of breath: Secondary | ICD-10-CM | POA: Diagnosis present

## 2021-07-14 DIAGNOSIS — I10 Essential (primary) hypertension: Secondary | ICD-10-CM | POA: Insufficient documentation

## 2021-07-14 LAB — BASIC METABOLIC PANEL
Anion gap: 10 (ref 5–15)
Anion gap: 10 (ref 5–15)
BUN: 11 mg/dL (ref 8–23)
BUN: 11 mg/dL (ref 8–23)
CO2: 21 mmol/L — ABNORMAL LOW (ref 22–32)
CO2: 24 mmol/L (ref 22–32)
Calcium: 8.7 mg/dL — ABNORMAL LOW (ref 8.9–10.3)
Calcium: 8.7 mg/dL — ABNORMAL LOW (ref 8.9–10.3)
Chloride: 95 mmol/L — ABNORMAL LOW (ref 98–111)
Chloride: 94 mmol/L — ABNORMAL LOW (ref 98–111)
Creatinine, Ser: 0.76 mg/dL (ref 0.61–1.24)
Creatinine, Ser: 0.84 mg/dL (ref 0.61–1.24)
GFR, Estimated: >60 mL/min (ref >60)
GFR, Estimated: >60 mL/min (ref >60)
Glucose, Bld: 108 mg/dL — ABNORMAL HIGH (ref 70–99)
Glucose, Bld: 96 mg/dL (ref 70–99)
Potassium: 3.5 mmol/L (ref 3.5–5.1)
Potassium: 3.4 mmol/L — ABNORMAL LOW (ref 3.5–5.1)
Sodium: 126 mmol/L — ABNORMAL LOW (ref 135–145)
Sodium: 128 mmol/L — ABNORMAL LOW (ref 135–145)

## 2021-07-14 LAB — CBC WITH DIFFERENTIAL/PLATELET
Abs Immature Granulocytes: 0.05 10*3/uL (ref 0.00–0.07)
Basophils Absolute: 0 10*3/uL (ref 0.0–0.1)
Basophils Relative: 0 %
Eosinophils Absolute: 0.3 10*3/uL (ref 0.0–0.5)
Eosinophils Relative: 3 %
HCT: 41.8 % (ref 39.0–52.0)
Hemoglobin: 13.9 g/dL (ref 13.0–17.0)
Immature Granulocytes: 0 %
Lymphocytes Relative: 18 %
Lymphs Abs: 2.1 10*3/uL (ref 0.7–4.0)
MCH: 28.1 pg (ref 26.0–34.0)
MCHC: 33.3 g/dL (ref 30.0–36.0)
MCV: 84.4 fL (ref 80.0–100.0)
Monocytes Absolute: 1.2 10*3/uL — ABNORMAL HIGH (ref 0.1–1.0)
Monocytes Relative: 11 %
Neutro Abs: 7.8 10*3/uL — ABNORMAL HIGH (ref 1.7–7.7)
Neutrophils Relative %: 68 %
Platelets: 265 10*3/uL (ref 150–400)
RBC: 4.95 MIL/uL (ref 4.22–5.81)
RDW: 13.8 % (ref 11.5–15.5)
WBC: 11.5 10*3/uL — ABNORMAL HIGH (ref 4.0–10.5)
nRBC: 0 % (ref 0.0–0.2)

## 2021-07-14 MED ORDER — ALBUTEROL SULFATE HFA 108 (90 BASE) MCG/ACT IN AERS: 1.0000 | INHALATION_SPRAY | Freq: Four times a day (QID) | RESPIRATORY_TRACT | 0 refills | Status: DC | PRN

## 2021-07-14 MED ORDER — ALBUTEROL SULFATE (2.5 MG/3ML) 0.083% IN NEBU
2.5000 mg | INHALATION_SOLUTION | Freq: Once | RESPIRATORY_TRACT | Status: AC
  Administered 2021-07-14: 2.5 mg via RESPIRATORY_TRACT

## 2021-07-14 MED ORDER — ALBUTEROL SULFATE HFA 108 (90 BASE) MCG/ACT IN AERS
2.0000 | INHALATION_SPRAY | RESPIRATORY_TRACT | Status: DC | PRN
Start: 2021-07-14 — End: 2021-07-14

## 2021-07-14 NOTE — ED Provider Notes (Addendum)
[facility]    CSN: [id number] Arrival date & time: 07/14/21  1124    HISTORY   Chief Complaint  Patient presents with   Cough   Sore Throat   Nasal Congestion   HPI Christopher Perez is a 76 y.o. male. Patient presents to urgent care today complaining of a 5-day history of congestion, productive cough and sore throat.  Patient states the cough has been persistent and leaves him feeling short of breath.  Patient has elevated blood pressure on arrival today and oxygen saturation of 91%.  Patient is afebrile with normal respirations and normal heart rate.  Patient's wife is positive for influenza A.  Patient denies chest pain at this time.  Patient reports compliance with all of his prescribed medications.  EMR reviewed.  Patient was seen in the emergency room February 05, 2021 for cough lingering suspected RSV due to exposure and exposure to influenza A from his wife.  Patient was provided with an albuterol inhaler and low-dose steroid for treatment.  Patient states he has done well since completing treatment.    Patient has a past medical history significant for coronary artery disease, carotid artery stenosis and stroke.  Patient has PCI and a large OM branch of the circumflex.  Patient has regular follow-up with cardiology, was last seen 3 days ago.  At that visit, he complained of feeling progressively more fatigued.  Cardiology recommended he undergo carotid ultrasound and a Lexiscan Myoview stress test.  Patient strep test is still pending but carotid ultrasound was completed today.  The results have been included in this note for your review.  The history is provided by the patient.     Past Medical History:  Diagnosis Date   Arthritis    CAD (coronary artery disease)    GERD (gastroesophageal reflux disease)    OCC   HLD (hyperlipidemia)    HTN (hypertension)    Neuromuscular disorder (HCC)    NUMB/ TINGLING TOES   Stroke (HCC)    ? TIA    5-6 YRS AGO   Patient  Active Problem List   Diagnosis Date Noted   Rectal bleeding 07/08/2019   History of colonic polyps 07/08/2019   Statin myopathy 07/07/2019   Spondylolisthesis of lumbar region 07/06/2015   Preoperative clearance 06/11/2012   Erectile dysfunction 10/03/2010   CAROTID ARTERY STENOSIS, WITHOUT INFARCTION 10/02/2009   Dyslipidemia 07/24/2008   Essential hypertension 07/24/2008   CAD 07/24/2008   Past Surgical History:  Procedure Laterality Date   CARDIAC CATHETERIZATION     CORONARY ANGIOPLASTY     STENT PLACED  03/2006   FOOT SURGERY     RT FOOT   KNEE ARTHROSCOPY     RT KNEE ,  MENISCUS REP    Home Medications    Prior to Admission medications   Medication Sig Start Date End Date Taking? Authorizing Provider  albuterol (VENTOLIN HFA) 108 (90 Base) MCG/ACT inhaler Inhale 1-2 puffs into the lungs every 6 (six) hours as needed for wheezing or shortness of breath. Patient not taking: Reported on 07/11/2021 02/05/21   Rhys Martini, PA-C  aspirin EC 81 MG tablet Take 1 tablet (81 mg total) by mouth daily. Swallow whole. 07/11/21   Tonny Bollman, MD  dicyclomine (BENTYL) 20 MG tablet Take 20 mg by mouth every 4 (four) hours as needed for spasms (STOMACH).  08/27/14   [provider]  diltiazem (CARDIZEM CD) 120 MG 24 hr capsule Take 1 capsule by mouth once  daily 09/16/20   Tonny Bollman, MD  ezetimibe (ZETIA) 10 MG tablet Take 1 tablet (10 mg total) by mouth daily. 07/13/21   Tonny Bollman, MD  famotidine (PEPCID) 40 MG tablet Take 1 tablet (40 mg total) by mouth at bedtime. Patient taking differently: Take 40 mg by mouth as needed. 07/08/19   Arnaldo Natal, NP  magnesium gluconate (MAGONATE) 500 MG tablet Take 500 mg by mouth daily. Per patient taking 400 mg daily    [provider]  sildenafil (VIAGRA) 50 MG tablet Take 1 tablet (50 mg total) by mouth daily as needed for erectile dysfunction. 05/21/19   Tonny Bollman, MD  vitamin C (ASCORBIC ACID) 500 MG  tablet Take 500 mg by mouth daily.    [provider]  zinc gluconate 50 MG tablet Take 50 mg by mouth daily.    [provider]   Family History Family History  Problem Relation Age of Onset   Heart attack Neg Hx    Stroke Neg Hx    Hypertension Neg Hx    Colon cancer Neg Hx    Esophageal cancer Neg Hx    Pancreatic cancer Neg Hx    Stomach cancer Neg Hx    Social History Social History   Tobacco Use   Smoking status: Former    Types: Cigarettes    Quit date: 04/10/1983    Years since quitting: 38.2   Smokeless tobacco: Former  Building services engineer Use: Never used  Substance Use Topics   Alcohol use: Yes    Comment: OCC   Drug use: No   Allergies   Statins, Praluent [alirocumab], and Repatha [evolocumab]  Review of Systems Review of Systems Pertinent findings noted in history of present illness.   Physical Exam Triage Vital Signs ED Triage Vitals  Enc Vitals Group     BP 02/03/21 0827 (!) 147/82     Pulse Rate 02/03/21 0827 72     Resp 02/03/21 0827 18     Temp 02/03/21 0827 98.3 F (36.8 C)     Temp Source 02/03/21 0827 Oral     SpO2 02/03/21 0827 98 %     Weight --      Height --      Head Circumference --      Peak Flow --      Pain Score 02/03/21 0826 5     Pain Loc --      Pain Edu? --      Excl. in GC? --   No data found.  Updated Vital Signs BP (!) 162/82 (BP Location: Left Arm)   Pulse 82   Temp 98.4 F (36.9 C) (Oral)   Resp 20   SpO2 91% Comment: Provider aware  Physical Exam Vitals and nursing note reviewed.  Constitutional:      General: He is not in acute distress.    Appearance: Normal appearance. He is obese. He is ill-appearing. He is not toxic-appearing or diaphoretic.  HENT:     Head: Normocephalic and atraumatic.  Cardiovascular:     Rate and Rhythm: Normal rate and regular rhythm.     Pulses: Normal pulses.     Heart sounds: Normal heart sounds.  Pulmonary:     Effort: Pulmonary effort is normal. No  tachypnea, bradypnea, accessory muscle usage, prolonged expiration, respiratory distress or retractions.     Breath sounds: No stridor, decreased air movement or transmitted upper airway sounds. Examination of the right-upper field reveals wheezing  and rales. Examination of the left-upper field reveals wheezing and rales. Examination of the right-middle field reveals wheezing, rhonchi and rales. Examination of the left-middle field reveals wheezing and rales. Examination of the right-lower field reveals wheezing and rales. Examination of the left-lower field reveals wheezing and rales. Wheezing, rhonchi and rales present. No decreased breath sounds.  Lymphadenopathy:     Cervical: Cervical adenopathy present.  Neurological:     Mental Status: He is alert.    Visual Acuity Right Eye Distance:   Left Eye Distance:   Bilateral Distance:    Right Eye Near:   Left Eye Near:    Bilateral Near:     [facility] Couse / Diagnostics / Procedures:    EKG  Radiology VAS US CAROTID  Result Date: 07/14/2021 Carotid Arterial Duplex Study Patient Name:  Christopher Perez  Date of Exam:   07/14/2021 Medical Rec #: [id number]           Accession #:    [id number] Date of Birth: 1945/08/31          Patient Gender: M Patient Age:   37 years Exam Location:  [facility] Procedure:      VAS US CAROTID Referring Phys: Casimiro Needle COOPER --------------------------------------------------------------------------------  Indications:       Carotid artery disease. Risk Factors:      Hypertension, hyperlipidemia, past history of smoking,                    coronary artery disease. Other Factors:     Patient denies any cerebrovascular symptoms. Comparison Study:  05/2019-RICA 84/18 cm/s: LICA 80/29 cm/s Performing Technologist: Dondra Prader RVT  Examination Guidelines: A complete evaluation includes B-mode imaging, spectral Doppler, color Doppler, and power Doppler as needed of all accessible portions of each vessel. Bilateral testing is  considered an integral part of a complete examination. Limited examinations for reoccurring indications may be performed as noted.  Right Carotid Findings: +----------+--------+--------+--------+--------------------------+--------+           PSV cm/sEDV cm/sStenosisPlaque Description        Comments +----------+--------+--------+--------+--------------------------+--------+ CCA Prox  122     26                                                 +----------+--------+--------+--------+--------------------------+--------+ CCA Distal84      19                                                 +----------+--------+--------+--------+--------------------------+--------+ ICA Prox  55      16      1-39%   heterogenous and irregular         +----------+--------+--------+--------+--------------------------+--------+ ICA Mid   62      18                                                 +----------+--------+--------+--------+--------------------------+--------+ ICA Distal51      15                                                 +----------+--------+--------+--------+--------------------------+--------+  ECA       93      14              heterogenous and irregular         +----------+--------+--------+--------+--------------------------+--------+ +----------+--------+-------+--------+-------------------+           PSV cm/sEDV cmsDescribeArm Pressure (mmHG) +----------+--------+-------+--------+-------------------+ [id number]            Stenotic144                 +----------+--------+-------+--------+-------------------+ +---------+--------+--+--------+-+---------+ VertebralPSV cm/s39EDV cm/s9Antegrade +---------+--------+--+--------+-+---------+  Left Carotid Findings: +----------+--------+--------+--------+--------------------------+--------+           PSV cm/sEDV cm/sStenosisPlaque Description        Comments  +----------+--------+--------+--------+--------------------------+--------+ CCA Prox  87      22                                                 +----------+--------+--------+--------+--------------------------+--------+ CCA Distal72      17                                                 +----------+--------+--------+--------+--------------------------+--------+ ICA Prox  72      19      1-39%   heterogenous and irregular         +----------+--------+--------+--------+--------------------------+--------+ ICA Mid   84      25                                                 +----------+--------+--------+--------+--------------------------+--------+ ICA Distal76      27                                                 +----------+--------+--------+--------+--------------------------+--------+ ECA       136     22              heterogenous and irregular         +----------+--------+--------+--------+--------------------------+--------+ +----------+--------+--------+----------------+-------------------+           PSV cm/sEDV cm/sDescribe        Arm Pressure (mmHG) +----------+--------+--------+----------------+-------------------+ [id number]             Multiphasic, [id number]                 +----------+--------+--------+----------------+-------------------+ +---------+--------+--+--------+-+---------+ VertebralPSV cm/s38EDV cm/s8Antegrade +---------+--------+--+--------+-+---------+   Summary: Right Carotid: Velocities in the right ICA are consistent with a 1-39% stenosis. Left Carotid: Velocities in the left ICA are consistent with a 1-39% stenosis. Vertebrals:  Bilateral vertebral arteries demonstrate antegrade flow. Subclavians: Right subclavian artery was stenotic. Right subclavian artery flow              was disturbed. Normal flow hemodynamics were seen in the left              subclavian artery. *See table(s) above for measurements and observations.   Electronically signed by Charlton Haws MD on 07/14/2021 at 10:59:25 AM.    Final     Procedures Procedures (including critical  care time)  UC Diagnoses / Final Clinical Impressions(s)   I have reviewed the triage vital signs and the nursing notes.  Pertinent labs & imaging results that were available during my care of the patient were reviewed by me and considered in my medical decision making (see chart for details).   Final diagnoses:  Respiratory infection  Exposure to influenza  Acute cough  Wheezing  Bilateral rales   Patient was provided with nebulized albuterol treatment during his visit today.  Repeat auscultation revealed no improvement of breath sounds and patient's cough was unchanged.  Patient was advised that given his exposure to influenza A, his history of acute bronchitis, coronary artery disease and stroke, I recommend that he go to the emergency room for more acute evaluation and treatment of possible bronchitis versus pneumonia secondary to influenza A infection.  Patient was agreeable to going now by private vehicle.  Patient states he plans to go to the emergency room at [facility].  ED Prescriptions   None    PDMP not reviewed this encounter.  Pending results:  Labs Reviewed - No data to display  Medications Ordered in UC: Medications  albuterol (PROVENTIL) (2.5 MG/3ML) 0.083% nebulizer solution 2.5 mg (2.5 mg Nebulization Given 07/14/21 1203)    Disposition Upon Discharge:  Condition: stable for discharge home Home: take medications as prescribed; routine discharge instructions as discussed; follow up as advised.  Patient presented with an acute illness with associated systemic symptoms and significant discomfort requiring urgent management. In my opinion, this is a condition that a prudent lay person (someone who possesses an average knowledge of health and medicine) may potentially expect to result in complications if not addressed urgently such as  respiratory distress, impairment of bodily function or dysfunction of bodily organs.   Routine symptom specific, illness specific and/or disease specific instructions were discussed with the patient and/or caregiver at length.   As such, the patient has been evaluated and assessed, work-up was performed and treatment was provided in alignment with urgent care protocols and evidence based medicine.  Patient/parent/caregiver has been advised that the patient may require follow up for further testing and treatment if the symptoms continue in spite of treatment, as clinically indicated and appropriate.  If the patient was tested for COVID-19, Influenza and/or RSV, then the patient/parent/guardian was advised to isolate at home pending the results of his/her diagnostic coronavirus test and potentially longer if they're positive. I have also advised pt that if his/her COVID-19 test returns positive, it's recommended to self-isolate for at least 10 days after symptoms first appeared AND until fever-free for 24 hours without fever reducer AND other symptoms have improved or resolved. Discussed self-isolation recommendations as well as instructions for household member/close contacts as per the [facility] [place] DHHS, and also gave patient the COVID packet with this information.  Patient/parent/caregiver has been advised to return to the [facility] or PCP in 3-5 days if no better; to PCP or the Emergency Department if new signs and symptoms develop, or if the current signs or symptoms continue to change or worsen for further workup, evaluation and treatment as clinically indicated and appropriate  The patient will follow up with their current PCP if and as advised. If the patient does not currently have a PCP we will assist them in obtaining one.   The patient may need specialty follow up if the symptoms continue, in spite of conservative treatment and management, for further workup, evaluation, consultation and treatment as  clinically indicated and appropriate.  Patient/parent/caregiver verbalized understanding and agreement of plan as discussed.  All questions were addressed during visit.  Please see discharge instructions below for further details of plan.  Discharge Instructions: Discharge Instructions   None     This office note has been dictated using Dragon speech recognition software.  Unfortunately, and despite my best efforts, this method of dictation can sometimes lead to occasional typographical or grammatical errors.  I apologize in advance if this occurs.     Theadora Rama Scales, PA-C 07/14/21 1214    Theadora Rama Scales, [place] 07/14/21 1215

## 2021-07-14 NOTE — ED Notes (Signed)
Patient is being discharged from the Urgent Care and sent to the Emergency Department via POA with spouse. Per L. Morgan-Scales PA-C, patient is in need of higher level of care due to need for further evaluation. Patient is aware and verbalizes understanding of plan of care.  ?Vitals:  ? 07/14/21 1142  ?BP: (!) 162/82  ?Pulse: 82  ?Resp: 20  ?Temp: 98.4 ?F (36.9 ?C)  ?SpO2: 91%  ?  ?

## 2021-07-14 NOTE — ED Triage Notes (Signed)
Cough x 4 days , was seen at UC , breathing treatment  with no relief . Sent to ED for further evaluation.  ?Shortness of breath , denies chest pain , pt sound congested and productive cough during triage .  ?

## 2021-07-14 NOTE — ED Provider Notes (Signed)
?[facility] EMERGENC[facility] DEPARTMENT ?Provider Note ? ? ?CSN: [id number] ?Arrival date & time: 07/14/21  1224 ? ?  ? ?History ?PMH: Dyslipidemia, HTN, CAD, Carotid artery stenosis, TIA, GERD ?Chief Complaint  ?Patient presents with  ? Shortness of Breath  ? ? ?Christopher Perez is a 76 y.o. male. ?Presents the ED with a chief complaint of cough.  He says he went over to urgent care earlier and was sent to the emergency department.  He states that his cough started about 4 days ago.  It is productive with yellow sputum.  He has also had an intermittent fever that has been up to 103 at home.  He endorses rhinorrhea, sore throat, and decreased appetite.  He denies any chest pain, but does state his session is sore from coughing so much.  He denies any shortness of breath.  He feels like he is actually getting slightly better.  His wife has similar symptoms but she is a lot worse at him.  ? ?Urgent care sent him over here to be reevaluated.  Apparently the provider noticed abnormal lung sounds and gave him a breathing treatment.  This did not improve after albuterol.  He was also reportedly saturating at 91% at urgent care. ? ? ?Shortness of Breath ?Associated symptoms: cough, fever and sore throat   ?Associated symptoms: no abdominal pain, no chest pain, no vomiting and no wheezing   ? ?  ? ?Home Medications ?Prior to Admission medications   ?Medication Sig Start Date End Date Taking? Authorizing Provider  ?albuterol (VENTOLIN HFA) 108 (90 Base) MCG/ACT inhaler Inhale 1-2 puffs into the lungs every 6 (six) hours as needed for up to 5 days for wheezing or shortness of breath. 07/14/21 07/19/21  Noelani Harbach, Adora Fridge, PA-C  ?aspirin EC 81 MG tablet Take 1 tablet (81 mg total) by mouth daily. Swallow whole. 07/11/21   Sherren Mocha, MD  ?dicyclomine (BENTYL) 20 MG tablet Take 20 mg by mouth every 4 (four) hours as needed for spasms (STOMACH).  08/27/14   [provider]  ?diltiazem (CARDIZEM CD) 120 MG 24 hr capsule  Take 1 capsule by mouth once daily 09/16/20   Sherren Mocha, MD  ?ezetimibe (ZETIA) 10 MG tablet Take 1 tablet (10 mg total) by mouth daily. 07/13/21   Sherren Mocha, MD  ?famotidine (PEPCID) 40 MG tablet Take 1 tablet (40 mg total) by mouth at bedtime. ?Patient taking differently: Take 40 mg by mouth as needed. 07/08/19   Noralyn Pick, NP  ?magnesium gluconate (MAGONATE) 500 MG tablet Take 500 mg by mouth daily. Per patient taking 400 mg daily    [provider]  ?sildenafil (VIAGRA) 50 MG tablet Take 1 tablet (50 mg total) by mouth daily as needed for erectile dysfunction. 05/21/19   Sherren Mocha, MD  ?vitamin C (ASCORBIC ACID) 500 MG tablet Take 500 mg by mouth daily.    [provider]  ?zinc gluconate 50 MG tablet Take 50 mg by mouth daily.    [provider]  ?   ? ?Allergies    ?Statins, Praluent [alirocumab], and Repatha [evolocumab]   ? ?Review of Systems   ?Review of Systems  ?Constitutional:  Positive for appetite change, chills and fever.  ?HENT:  Positive for congestion, rhinorrhea and sore throat.   ?Respiratory:  Positive for cough. Negative for chest tightness, shortness of breath and wheezing.   ?Cardiovascular:  Negative for chest pain and leg swelling.  ?Gastrointestinal:  Negative for abdominal pain, nausea,  rectal pain and vomiting.  ?All other systems reviewed and are negative. ? ?Physical Exam ?Updated Vital Signs ?BP 140/76   Pulse 78   Temp 98.9 ?F (37.2 ?C) (Oral)   Resp 14   Wt 94.3 kg   SpO2 97%   BMI 29.84 kg/m?  ?Physical Exam ?Vitals and nursing note reviewed.  ?Constitutional:   ?   General: He is not in acute distress. ?   Appearance: Normal appearance. He is well-developed. He is not ill-appearing, toxic-appearing or diaphoretic.  ?HENT:  ?   Head: Normocephalic and atraumatic.  ?   Nose: No nasal deformity.  ?   Mouth/Throat:  ?   Lips: Pink. No lesions.  ?   Mouth: Mucous membranes are moist. No injury, lacerations, oral lesions or  angioedema.  ?   Pharynx: Oropharynx is clear. Uvula midline. No pharyngeal swelling, oropharyngeal exudate, posterior oropharyngeal erythema or uvula swelling.  ?Eyes:  ?   General: Gaze aligned appropriately. No scleral icterus.    ?   Right eye: No discharge.     ?   Left eye: No discharge.  ?   Conjunctiva/sclera: Conjunctivae normal.  ?   Right eye: Right conjunctiva is not injected. No exudate or hemorrhage. ?   Left eye: Left conjunctiva is not injected. No exudate or hemorrhage. ?Neck:  ?   Trachea: No tracheal deviation.  ?Cardiovascular:  ?   Rate and Rhythm: Normal rate and regular rhythm.  ?   Pulses: Normal pulses.     ?     Radial pulses are 2+ on the right side and 2+ on the left side.  ?     Dorsalis pedis pulses are 2+ on the right side and 2+ on the left side.  ?   Heart sounds: Normal heart sounds, S1 normal and S2 normal. Heart sounds not distant. No murmur heard. ?  No friction rub. No gallop. No S3 or S4 sounds.  ?Pulmonary:  ?   Effort: Pulmonary effort is normal. No tachypnea, bradypnea, accessory muscle usage or respiratory distress.  ?   Breath sounds: No stridor. Examination of the right-upper field reveals rhonchi. Examination of the left-upper field reveals rhonchi. Rhonchi present. No decreased breath sounds, wheezing or rales.  ?   Comments: Lung sounds improved after productive cough. ?Chest:  ?   Chest wall: No tenderness.  ?Abdominal:  ?   General: Abdomen is flat. Bowel sounds are normal. There is no distension.  ?   Palpations: Abdomen is soft. There is no mass or pulsatile mass.  ?   Tenderness: There is no abdominal tenderness. There is no guarding or rebound.  ?Musculoskeletal:  ?   Cervical back: Neck supple.  ?   Right lower leg: No edema.  ?   Left lower leg: No edema.  ?Lymphadenopathy:  ?   Cervical: No cervical adenopathy.  ?Skin: ?   General: Skin is warm and dry.  ?   Coloration: Skin is not jaundiced or pale.  ?   Findings: No bruising, erythema, lesion or rash.   ?Neurological:  ?   General: No focal deficit present.  ?   Mental Status: He is alert and oriented to person, place, and time.  ?   GCS: GCS eye subscore is 4. GCS verbal subscore is 5. GCS motor subscore is 6.  ?Psychiatric:     ?   Mood and Affect: Mood normal.     ?   Behavior: Behavior normal. Behavior is cooperative.  ? ? ?  ED Results / Procedures / Treatments   ?Labs ?(all labs ordered are listed, but only abnormal results are displayed) ?Labs Reviewed  ?BASIC METABOLIC PANEL - Abnormal; Notable for the following components:  ?    Result Value  ? Sodium 126 (*)   ? Chloride 95 (*)   ? CO2 21 (*)   ? Glucose, Bld 108 (*)   ? Calcium 8.7 (*)   ? All other components within normal limits  ?CBC WITH DIFFERENTIAL/PLATELET - Abnormal; Notable for the following components:  ? WBC 11.5 (*)   ? Neutro Abs 7.8 (*)   ? Monocytes Absolute 1.2 (*)   ? All other components within normal limits  ?BASIC METABOLIC PANEL - Abnormal; Notable for the following components:  ? Sodium 128 (*)   ? Potassium 3.4 (*)   ? Chloride 94 (*)   ? Calcium 8.7 (*)   ? All other components within normal limits  ? ? ?EKG ?EKG Interpretation ? ?Date/Time:  Friday July 14 2021 12:40:04 EDT ?Ventricular Rate:  79 ?PR Interval:  160 ?QRS Duration: 109 ?QT Interval:  396 ?QTC Calculation: 326 ?R Axis:   69 ?Text Interpretation: Sinus rhythm Abnormal R-wave progression, early transition Repol abnrm suggests ischemia, diffuse leads No significant change since last tracing Confirmed by Blanchie Dessert [phone]) on 07/14/2021 1:42:02 PM ? ?Radiology ?DG Chest 2 View ? ?Result Date: 07/14/2021 ?CLINICAL DATA:  Shortness of breath EXAM: CHEST - 2 VIEW COMPARISON:  03/11/2006 FINDINGS: The heart size and mediastinal contours are within normal limits. Both lungs are clear. The visualized skeletal structures are unremarkable. IMPRESSION: No active cardiopulmonary disease. Electronically Signed   By: Elmer Picker M.D.   On: 07/14/2021 13:04  ? ?VAS US  CAROTID ? ?Result Date: 07/14/2021 ?Carotid Arterial Duplex Study Patient Name:  Christopher Perez  Date of Exam:   07/14/2021 Medical Rec #: [id number]           Accession #:    [id number] Date of Birth: September 20, 1945

## 2021-07-14 NOTE — ED Triage Notes (Addendum)
Pt states for 5 days he has been having congestion, productive cough, and sore throat.  ?

## 2021-07-14 NOTE — Discharge Instructions (Addendum)
Your sodium was low today. Drink plenty of electrolyte type drinks this afternoon such as gatorade. ?We unfortunately could not get your flu swab to run, but I think it is safe to say that since your wife was confirmed positive, then you likely have it as well. ?Treat your symptoms supportively with over the counter medications.  ?If you do not get better or significantly worsen, you should return to the ED ?I have prescribed you an inhaler for home use to use as needed.  ?

## 2021-07-14 NOTE — ED Notes (Signed)
ED Provider at bedside. 

## 2021-07-18 ENCOUNTER — Encounter (HOSPITAL_COMMUNITY): Payer: PPO

## 2021-07-20 ENCOUNTER — Telehealth (HOSPITAL_COMMUNITY): Payer: Self-pay | Admitting: Cardiovascular Disease

## 2021-07-20 NOTE — Telephone Encounter (Signed)
Patient called and cancelled Myoview for reason below: ? ?07/19/2021 1:58 PM AT:[id number], CYNTHIA W  ?Cancel Rsn: Patient (Patient still sick. Patient will call back to r/s) ? ?Order will be removed from the active Echo/Nuc WQ. When patient calls back to reschedule we will reinstate the order. ?Thank you ?

## 2021-07-26 ENCOUNTER — Encounter (HOSPITAL_COMMUNITY): Payer: PPO

## 2021-07-26 ENCOUNTER — Telehealth (HOSPITAL_COMMUNITY): Payer: Self-pay

## 2021-07-26 NOTE — Telephone Encounter (Signed)
Spoke with the patient, detailed instructions given. He stated that he would be here for his test. Asked to call back with any questions. Christopher Perez EMTP 

## 2021-08-01 ENCOUNTER — Ambulatory Visit (HOSPITAL_COMMUNITY): Payer: PPO | Attending: Internal Medicine

## 2021-08-01 DIAGNOSIS — I25118 Atherosclerotic heart disease of native coronary artery with other forms of angina pectoris: Secondary | ICD-10-CM | POA: Insufficient documentation

## 2021-08-01 LAB — MYOCARDIAL PERFUSION IMAGING
LV dias vol: 95 mL (ref 62–150)
LV sys vol: 31 mL
Nuc Stress EF: 67 %
Peak HR: 82 {beats}/min
Rest HR: 60 {beats}/min
Rest Nuclear Isotope Dose: 31.9 mCi
SDS: 1
SRS: 0
SSS: 1
ST Depression (mm): 0 mm
TID: 1.01

## 2021-08-01 MED ORDER — REGADENOSON 0.4 MG/5ML IV SOLN
0.4000 mg | Freq: Once | INTRAVENOUS | Status: AC
  Administered 2021-08-01: 0.4 mg via INTRAVENOUS

## 2021-08-01 MED ORDER — TECHNETIUM TC 99M TETROFOSMIN IV KIT
31.9000 | PACK | Freq: Once | INTRAVENOUS | Status: AC | PRN
  Administered 2021-08-01: 31.9 via INTRAVENOUS
  Filled 2021-08-01: qty 32

## 2021-08-01 MED ORDER — TECHNETIUM TC 99M TETROFOSMIN IV KIT
10.9000 | PACK | Freq: Once | INTRAVENOUS | Status: AC | PRN
  Administered 2021-08-01: 10.9 via INTRAVENOUS
  Filled 2021-08-01: qty 11

## 2021-12-25 ENCOUNTER — Other Ambulatory Visit: Payer: Self-pay | Admitting: Cardiovascular Disease

## 2022-01-08 ENCOUNTER — Other Ambulatory Visit: Payer: Self-pay | Admitting: Cardiovascular Disease

## 2022-01-10 NOTE — Telephone Encounter (Signed)
Pt's pharmacy is requesting a refill on sildenafil. Would Dr. Burt Knack like to refill this medication? Please address

## 2022-01-12 NOTE — Telephone Encounter (Signed)
Refill sent to pharmacy at this time.

## 2022-02-24 ENCOUNTER — Other Ambulatory Visit: Payer: Self-pay | Admitting: Cardiovascular Disease

## 2022-02-28 NOTE — Telephone Encounter (Signed)
Pt's pharmacy requested refill on Sildenafil '50mg'$  #30. Called patient to ensure he needs it because script was sent into pharmacy on 01/12/22 with directions of take daily as needed. Pt states he has taken all 30 tablets and declares "they don't work that well." He says that he began taking two tablets at once and still didn't get results needed. Advised I will forward to Dr Burt Knack for review. Patient may need to see urology to ensure no other issues are going on.

## 2022-03-04 NOTE — Telephone Encounter (Signed)
I agree - probably best for him to see urology. Shouldn't be taking this much sildenafil and since it hasn't been effective he should seek out other treatment options.

## 2022-03-13 ENCOUNTER — Encounter: Payer: Self-pay | Admitting: Oncology

## 2022-03-13 ENCOUNTER — Inpatient Hospital Stay: Payer: PPO | Admitting: Oncology

## 2022-03-13 ENCOUNTER — Other Ambulatory Visit: Payer: Self-pay | Admitting: Oncology

## 2022-03-13 ENCOUNTER — Inpatient Hospital Stay: Payer: PPO | Attending: Oncology

## 2022-03-13 DIAGNOSIS — D649 Anemia, unspecified: Secondary | ICD-10-CM | POA: Diagnosis present

## 2022-03-13 LAB — CMP (CANCER CENTER ONLY)
ALT: 23 U/L (ref 0–44)
AST: 23 U/L (ref 15–41)
Albumin: 4.3 g/dL (ref 3.5–5.0)
Alkaline Phosphatase: 61 U/L (ref 38–126)
Anion gap: 8 (ref 5–15)
BUN: 17 mg/dL (ref 8–23)
CO2: 25 mmol/L (ref 22–32)
Calcium: 9.6 mg/dL (ref 8.9–10.3)
Chloride: 104 mmol/L (ref 98–111)
Creatinine: 0.95 mg/dL (ref 0.61–1.24)
GFR, Estimated: >60 mL/min (ref >60)
Glucose, Bld: 103 mg/dL — ABNORMAL HIGH (ref 70–99)
Potassium: 4.1 mmol/L (ref 3.5–5.1)
Sodium: 137 mmol/L (ref 135–145)
Total Bilirubin: 0.5 mg/dL (ref 0.3–1.2)
Total Protein: 8.1 g/dL (ref 6.5–8.1)

## 2022-03-13 LAB — CBC: RBC: 5.08 (ref 3.87–5.11)

## 2022-03-13 LAB — CBC AND DIFFERENTIAL
HCT: 43 (ref 41–53)
Hemoglobin: 14.5 (ref 13.5–17.5)
Neutrophils Absolute: 3.7
Platelets: 300 10*3/uL (ref 150–400)
WBC: 6.6

## 2022-03-13 LAB — VITAMIN B12: Vitamin B-12: 1590 pg/mL — ABNORMAL HIGH (ref 180–914)

## 2022-03-13 LAB — TSH: TSH: 3.569 u[IU]/mL (ref 0.350–4.500)

## 2022-03-13 NOTE — Progress Notes (Signed)
[facility]  [address] [place],  [place]  [address] [phone]  Clinic Day:  03/13/2022  Referring physician: Angelina Sheriff, MD   HISTORY OF PRESENT ILLNESS:  The patient is a 76 y.o. male  who I was asked to consult upon for mild anemia.  Recent labs at his primary care office showed a low hemoglobin of 12.7.  His iron, B12 and folate levels were all normal.  Furethermore, he had a negative serum protein elecdtrophoresis.  His kidney function was also normal.  The patient denies having any overt forms of blood loss to explain his anemia.  His last colonoscopy was approximately 10 years ago, which came back unremarkable.  He denies being placed on any new medications which have the ability to cause anemia via bone marrow suppression.  To his knowledge, there is no family history of anemia or other hematologic disorders.  As his hemoglobin was at a low normal level, he is receiving B12 injections weekly.  Overall, the patient denies there being any particular changes in his health over these past months.  PAST MEDICAL HISTORY:   Past Medical History:  Diagnosis Date   Arthritis    CAD (coronary artery disease)    GERD (gastroesophageal reflux disease)    OCC   HLD (hyperlipidemia)    HTN (hypertension)    Neuromuscular disorder (HCC)    NUMB/ TINGLING TOES   Stroke ([facility])    ? TIA    5-6 YRS AGO    PAST SURGICAL HISTORY:   Past Surgical History:  Procedure Laterality Date   CARDIAC CATHETERIZATION     CORONARY ANGIOPLASTY     STENT PLACED  03/2006   FOOT SURGERY     RT FOOT   KNEE ARTHROSCOPY     RT KNEE ,  MENISCUS REP    CURRENT MEDICATIONS:   Current Outpatient Medications  Medication Sig Dispense Refill   albuterol (VENTOLIN HFA) 108 (90 Base) MCG/ACT inhaler Inhale into the lungs.     ascorbic acid (VITAMIN C) 1000 MG tablet Take 1,000 mg by mouth daily. Unknown start date     aspirin EC 81 MG tablet Take 1 tablet (81 mg  total) by mouth daily. Swallow whole. 90 tablet 3   Cyanocobalamin (B-12 COMPLIANCE INJECTION) 1000 MCG/ML KIT Inject as directed See admin instructions.     diltiazem (CARDIZEM CD) 120 MG 24 hr capsule Take 1 capsule by mouth once daily 90 capsule 1   ezetimibe (ZETIA) 10 MG tablet Take 1 tablet (10 mg total) by mouth daily. 90 tablet 3   MAGNESIUM BISGLYCINATE PO Take by mouth.     sildenafil (VIAGRA) 50 MG tablet TAKE ONE TABLET BY MOUTH DAILY AS NEEDED FOR ERECTILE DYSFUNCTION 30 tablet 0   zinc gluconate 50 MG tablet Take 50 mg by mouth daily.     albuterol (VENTOLIN HFA) 108 (90 Base) MCG/ACT inhaler Inhale 1-2 puffs into the lungs every 6 (six) hours as needed for up to 5 days for wheezing or shortness of breath. 1 each 0   dicyclomine (BENTYL) 20 MG tablet Take 20 mg by mouth every 4 (four) hours as needed for spasms (STOMACH).  (Patient not taking: Reported on 03/13/2022)     diltiazem (CARDIZEM CD) 120 MG 24 hr capsule Take by mouth. (Patient not taking: Reported on 03/13/2022)     famotidine (PEPCID) 40 MG tablet Take 1 tablet (40 mg total) by mouth at bedtime. (Patient not taking: Reported  on 03/13/2022) 30 tablet 1   magnesium gluconate (MAGONATE) 500 MG tablet Take 500 mg by mouth daily. Per patient taking 400 mg daily (Patient not taking: Reported on 03/13/2022)     triamcinolone acetonide (KENALOG-40) 40 MG/ML injection Inject into the articular space. (Patient not taking: Reported on 03/13/2022)     No current facility-administered medications for this visit.    ALLERGIES:   Allergies  Allergen Reactions   Other Other (See Comments)    Myalgias; Vytorin, Livalo 2 mg qd, Pravastatin 40 mg qd (2008), Crestor 10 mg qd (2008), Crestor 5 mg tiw (03/2013)   Statins Other (See Comments)    Myalgias; Vytorin, Livalo 2 mg qd, Pravastatin 40 mg qd (2008), Crestor 10 mg qd (2008), Crestor 5 mg tiw (03/2013)    Evolocumab Other (See Comments)    myalgias   Praluent [Alirocumab]      Myalgias    FAMILY HISTORY:   Family History  Problem Relation Age of Onset   Heart attack Neg Hx    Stroke Neg Hx    Hypertension Neg Hx    Colon cancer Neg Hx    Esophageal cancer Neg Hx    Pancreatic cancer Neg Hx    Stomach cancer Neg Hx     SOCIAL HISTORY:  The patient was born in [place], but has lived essentially all of his life in [place].  He currently lives in [place] with his wife of 35 years.  He has 1 child, 1 grandchild, and 1 great grandchild.  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  He did smoke a half a pack of cigarettes daily for 12 years before quitting 40 years ago.  He occasionally drinks a can of beer or alcoholic beverage.  REVIEW OF SYSTEMS:  Review of Systems  Constitutional:  Negative for fatigue, fever and unexpected weight change.  HENT:   Positive for hearing loss.   Respiratory:  Negative for chest tightness, cough, hemoptysis and shortness of breath.   Cardiovascular:  Negative for chest pain and palpitations.  Gastrointestinal:  Negative for abdominal distention, abdominal pain, blood in stool, constipation, diarrhea, nausea and vomiting.  Genitourinary:  Negative for dysuria, frequency and hematuria.   Musculoskeletal:  Positive for back pain. Negative for arthralgias and myalgias.  Skin:  Negative for itching and rash.  Neurological:  Negative for dizziness, headaches and light-headedness.  Psychiatric/Behavioral:  Negative for depression and suicidal ideas. The patient is not nervous/anxious.      PHYSICAL EXAM:  Blood pressure (!) 141/45, pulse (!) 54, temperature 98.1 F (36.7 C), temperature source Oral, resp. rate 20, height 5' 9.6" (1.768 m), weight 213 lb (96.6 kg), SpO2 96 %. Wt Readings from Last 3 Encounters:  03/13/22 213 lb (96.6 kg)  08/01/21 208 lb (94.3 kg)  07/14/21 208 lb (94.3 kg)   Body mass index is 30.91 kg/m. Performance status (ECOG): 0 - Asymptomatic Physical Exam Constitutional:       Appearance: Normal appearance. He is not ill-appearing.  HENT:     Mouth/Throat:     Mouth: Mucous membranes are moist.     Pharynx: Oropharynx is clear. No oropharyngeal exudate or posterior oropharyngeal erythema.  Cardiovascular:     Rate and Rhythm: Normal rate and regular rhythm.     Heart sounds: No murmur heard.    No friction rub. No gallop.  Pulmonary:     Effort: Pulmonary effort is normal. No respiratory distress.     Breath sounds: Normal breath  sounds. No wheezing, rhonchi or rales.  Abdominal:     General: Bowel sounds are normal. There is no distension.     Palpations: Abdomen is soft. There is no mass.     Tenderness: There is no abdominal tenderness.  Musculoskeletal:        General: No swelling.     Right lower leg: No edema.     Left lower leg: No edema.  Lymphadenopathy:     Cervical: No cervical adenopathy.     Upper Body:     Right upper body: No supraclavicular or axillary adenopathy.     Left upper body: No supraclavicular or axillary adenopathy.     Lower Body: No right inguinal adenopathy. No left inguinal adenopathy.  Skin:    General: Skin is warm.     Coloration: Skin is not jaundiced.     Findings: No lesion or rash.  Neurological:     General: No focal deficit present.     Mental Status: He is alert and oriented to person, place, and time. Mental status is at baseline.  Psychiatric:        Mood and Affect: Mood normal.        Behavior: Behavior normal.        Thought Content: Thought content normal.    LABS:       Latest Ref Rng & Units 07/14/2021   12:40 PM 07/11/2021    9:07 AM 07/07/2015    3:20 AM  CBC  WBC 4.0 - 10.5 K/uL 11.5  7.0  8.4   Hemoglobin 13.0 - 17.0 g/dL 13.9  14.6  12.1   Hematocrit 39.0 - 52.0 % 41.8  44.1  37.3   Platelets 150 - 400 K/uL 265  305  219       Latest Ref Rng & Units 07/14/2021    2:17 PM 07/14/2021   12:40 PM 07/11/2021    9:07 AM  CMP  Glucose 70 - 99 mg/dL 96  108  110   BUN 8 - 23 mg/dL _0 Creatinine 0.61 - 1.24 mg/dL 0.84  0.76  0.80   Sodium 135 - 145 mmol/L 128  126  137   Potassium 3.5 - 5.1 mmol/L 3.4  3.5  4.3   Chloride 98 - 111 mmol/L 94  95  100   CO2 22 - 32 mmol/L _1 Calcium 8.9 - 10.3 mg/dL 8.7  8.7  9.7   Total Protein 6.0 - 8.5 g/dL   7.2   Total Bilirubin 0.0 - 1.2 mg/dL   0.4   Alkaline Phos 44 - 121 IU/L   67   AST 0 - 40 IU/L   22   ALT 0 - 44 IU/L   24    ASSESSMENT & PLAN:  A 76 y.o. male who I was asked to consult upon for anemia.  However, his hemoglobin of 14.5 today is completely normal.  Furthermore, all of his hematologic parameters are completely normal.  As mentioned previously, numerous labs done at his primary care office did not show any underlying etiology behind his anemia.  As his hemoglobin is 14.5 today and he is clinically doing well, I do feel comfortable turning his care back over to his primary care office.  My recommendation would be for his hemoglobin to be checked, at most, 1-2 times per year.  I would not have a problem seeing him in the future if  his anemia returns or other hematologic issues develop that require repeat clinical assessment.  The patient understands all the plans discussed today and is in agreement with them.  I do appreciate Angelina Sheriff, MD for his new consult.   Deontay Ladnier Macarthur Critchley, MD

## 2022-03-19 LAB — METHYLMALONIC ACID, SERUM: Methylmalonic Acid, Quantitative: 98 nmol/L (ref 0–378)

## 2022-04-19 DIAGNOSIS — D519 Vitamin B12 deficiency anemia, unspecified: Secondary | ICD-10-CM | POA: Diagnosis not present

## 2022-05-14 DIAGNOSIS — D519 Vitamin B12 deficiency anemia, unspecified: Secondary | ICD-10-CM | POA: Diagnosis not present

## 2022-05-17 ENCOUNTER — Other Ambulatory Visit: Payer: Self-pay

## 2022-05-17 MED ORDER — DILTIAZEM HCL ER COATED BEADS 120 MG PO CP24: 120.0000 mg | ORAL_CAPSULE | Freq: Every day | ORAL | 0 refills | Status: DC

## 2022-06-08 DIAGNOSIS — D519 Vitamin B12 deficiency anemia, unspecified: Secondary | ICD-10-CM | POA: Diagnosis not present

## 2022-06-21 ENCOUNTER — Other Ambulatory Visit: Payer: Self-pay | Admitting: Cardiovascular Disease

## 2022-07-10 ENCOUNTER — Other Ambulatory Visit: Payer: Self-pay | Admitting: Cardiovascular Disease

## 2022-08-06 ENCOUNTER — Ambulatory Visit: Payer: PPO | Attending: Cardiovascular Disease | Admitting: Cardiovascular Disease

## 2022-08-06 ENCOUNTER — Encounter: Payer: Self-pay | Admitting: Cardiovascular Disease

## 2022-08-06 VITALS — BP 154/92 | HR 51 | Ht 70.0 in | Wt 216.4 lb

## 2022-08-06 DIAGNOSIS — R0989 Other specified symptoms and signs involving the circulatory and respiratory systems: Secondary | ICD-10-CM

## 2022-08-06 DIAGNOSIS — E782 Mixed hyperlipidemia: Secondary | ICD-10-CM | POA: Diagnosis not present

## 2022-08-06 DIAGNOSIS — I10 Essential (primary) hypertension: Secondary | ICD-10-CM | POA: Diagnosis not present

## 2022-08-06 DIAGNOSIS — I25118 Atherosclerotic heart disease of native coronary artery with other forms of angina pectoris: Secondary | ICD-10-CM | POA: Diagnosis not present

## 2022-08-06 NOTE — Progress Notes (Signed)
Cardiology Office Note:    Date:  08/06/2022   ID:  Akhilesh, Sassone 1946-03-01, MRN [id number]  PCP:  Noni Saupe, MD   [facility] Providers Cardiologist:  Tonny Bollman, MD     Referring MD: Noni Saupe, MD   Chief Complaint  Patient presents with   Coronary Artery Disease    History of Present Illness:    Christopher Perez is a 77 y.o. male with a hx of coronary artery disease, hypertension, mixed hyperlipidemia, presenting for follow-up evaluation. The patient initially presented in 2007 with exertional angina, was found to have high risk features on a nuclear stress test, and underwent PCI to treat critical stenosis in a large OM branch of the circumflex.   The patient is here alone today.  He is been doing fine from a cardiac perspective.  He denies chest pain, chest pressure, heart palpitations, edema, lightheadedness, orthopnea, or PND.  He is primarily limited by bilateral hip and lower back problems.  He plans to see an orthopedist in the near future.  He had low back surgery by Dr. Lovell Sheehan few years back.  He has been intolerant of all lipid-lowering therapies and has tried low-dose statins, Livalo, pravastatin, and PCSK9 inhibitors.  Last year I started him on Zetia and he has tolerated this without side effects.  He is fasting today for follow-up lipids.  Past Medical History:  Diagnosis Date   Arthritis    CAD (coronary artery disease)    GERD (gastroesophageal reflux disease)    OCC   HLD (hyperlipidemia)    HTN (hypertension)    Neuromuscular disorder (HCC)    NUMB/ TINGLING TOES   Stroke (HCC)    ? TIA    5-6 YRS AGO    Past Surgical History:  Procedure Laterality Date   CARDIAC CATHETERIZATION     CORONARY ANGIOPLASTY     STENT PLACED  03/2006   FOOT SURGERY     RT FOOT   KNEE ARTHROSCOPY     RT KNEE ,  MENISCUS REP    Current Medications: Current Meds  Medication Sig   ascorbic acid (VITAMIN C) 1000 MG tablet  Take 1,000 mg by mouth daily. Unknown start date   aspirin EC 81 MG tablet Take 1 tablet (81 mg total) by mouth daily. Swallow whole.   Cyanocobalamin (B-12 COMPLIANCE INJECTION) 1000 MCG/ML KIT Inject as directed See admin instructions.   dicyclomine (BENTYL) 20 MG tablet Take 20 mg by mouth every 4 (four) hours as needed for spasms (STOMACH).   diltiazem (CARDIZEM CD) 120 MG 24 hr capsule TAKE 1 CAPSULE EVERY DAY   ezetimibe (ZETIA) 10 MG tablet Take 1 tablet (10 mg total) by mouth daily. Pt must keep updated appt in April for further refills   magnesium gluconate (MAGONATE) 500 MG tablet Take 500 mg by mouth daily. Per patient taking 400 mg daily   zinc gluconate 50 MG tablet Take 50 mg by mouth daily.   [DISCONTINUED] albuterol (VENTOLIN HFA) 108 (90 Base) MCG/ACT inhaler Inhale into the lungs.   [DISCONTINUED] famotidine (PEPCID) 40 MG tablet Take 1 tablet (40 mg total) by mouth at bedtime.   [DISCONTINUED] MAGNESIUM BISGLYCINATE PO Take by mouth.   [DISCONTINUED] sildenafil (VIAGRA) 50 MG tablet TAKE ONE TABLET BY MOUTH DAILY AS NEEDED FOR ERECTILE DYSFUNCTION   [DISCONTINUED] triamcinolone acetonide (KENALOG-40) 40 MG/ML injection Inject into the articular space.     Allergies:   Other, Statins, Evolocumab, and  Praluent [alirocumab]   Social History   Socioeconomic History   Marital status: Married    Spouse name: Alona Bene   Number of children: 1   Years of education: 11   Highest education level: 11th grade  Occupational History   Occupation: Environmental manager: [employer]    Comment: Also was self employed Personnel officer  Tobacco Use   Smoking status: Former    Packs/day: .5    Types: Cigarettes    Quit date: 04/09/1981    Years since quitting: 41.3    Passive exposure: Past   Smokeless tobacco: Current    Types: Chew  Vaping Use   Vaping Use: Never used  Substance and Sexual Activity   Alcohol use: Not Currently    Comment: OCC   Drug use: Never    Sexual activity: Not Currently    Partners: Female  Other Topics Concern   Not on file  Social History Narrative   Not on file   Social Determinants of Health   Financial Resource Strain: Low Risk  (03/13/2022)   Overall Financial Resource Strain (CARDIA)    Difficulty of Paying Living Expenses: Not hard at all  Food Insecurity: No Food Insecurity (03/13/2022)   Hunger Vital Sign    Worried About Running Out of Food in the Last Year: Never true    Ran Out of Food in the Last Year: Never true  Transportation Needs: No Transportation Needs (03/13/2022)   PRAPARE - Administrator, Civil Service (Medical): No    Lack of Transportation (Non-Medical): No  Physical Activity: Sufficiently Active (03/13/2022)   Exercise Vital Sign    Days of Exercise per Week: 5 days    Minutes of Exercise per Session: 30 min  Stress: No Stress Concern Present (03/13/2022)   [employer] of Occupational Health - Occupational Stress Questionnaire    Feeling of Stress : Not at all  Social Connections: Unknown (03/13/2022)   Social Connection and Isolation Panel [NHANES]    Frequency of Communication with Friends and Family: Three times a week    Frequency of Social Gatherings with Friends and Family: Once a week    Attends Religious Services: Patient declined    Database administrator or Organizations: Patient declined    Attends Engineer, structural: Not on file    Marital Status: Married     Family History: The patient's family history is negative for Heart attack, Stroke, Hypertension, Colon cancer, Esophageal cancer, Pancreatic cancer, and Stomach cancer.  ROS:   Please see the history of present illness.    All other systems reviewed and are negative.  EKGs/Labs/Other Studies Reviewed:    The following studies were reviewed today: Cardiac Studies & Procedures     STRESS TESTS  MYOCARDIAL PERFUSION IMAGING 08/01/2021  Narrative   The study is normal. The study is low  risk.   No ST deviation was noted.   Left ventricular function is normal. Nuclear stress EF: 67 %. The left ventricular ejection fraction is hyperdynamic (>65%). End diastolic cavity size is normal.   Prior study not available for comparison.  Moderate size and intensity inferior lesion worse at rest; consistent with diaphragmatic attenuation artifact               EKG:  EKG is ordered today.  The ekg ordered today demonstrates sinus bradycardia 51 bpm, nonspecific ST abnormality, otherwise normal  Recent Labs: 03/13/2022: ALT 23; BUN 17; Creatinine 0.95; Hemoglobin 14.5;  Platelets 300; Potassium 4.1; Sodium 137; TSH 3.569  Recent Lipid Panel    Component Value Date/Time   CHOL 306 (H) 07/11/2021 0907   TRIG 194 (H) 07/11/2021 0907   HDL 50 07/11/2021 0907   CHOLHDL 6.1 (H) 07/11/2021 0907   CHOLHDL 3.1 11/11/2015 1050   VLDL 36 (H) 11/11/2015 1050   LDLCALC 218 (H) 07/11/2021 0907   LDLDIRECT 188.0 11/09/2014 0734     Risk Assessment/Calculations:          Physical Exam:    VS:  BP (!) 154/92   Pulse (!) 51   Ht 5\' 10"  (1.778 m)   Wt 216 lb 6.4 oz (98.2 kg)   SpO2 95%   BMI 31.05 kg/m     Wt Readings from Last 3 Encounters:  08/06/22 216 lb 6.4 oz (98.2 kg)  03/13/22 213 lb (96.6 kg)  08/01/21 208 lb (94.3 kg)     GEN:  Well nourished, well developed in no acute distress HEENT: Normal NECK: No JVD; left carotid bruit LYMPHATICS: No lymphadenopathy CARDIAC: RRR, no murmurs, rubs, gallops RESPIRATORY:  Clear to auscultation without rales, wheezing or rhonchi  ABDOMEN: Soft, non-tender, non-distended MUSCULOSKELETAL:  No edema; No deformity  SKIN: Warm and dry NEUROLOGIC:  Alert and oriented x 3 PSYCHIATRIC:  Normal affect   ASSESSMENT:    1. Coronary artery disease involving native coronary artery of native heart with other form of angina pectoris (HCC)   2. Mixed hyperlipidemia   3. Essential hypertension   4. Left carotid bruit    PLAN:    In order  of problems listed above:  Low risk Myoview last year noted.  No ischemia demonstrated.  Continue current management with aspirin and Zetia.  Continue diltiazem for antianginal therapy which she has tolerated long-term. Intolerant to almost all classes of lipid-lowering agents.  He was willing to try Zetia last year and has tolerated this well.  Follow-up lipids and LFTs to be drawn today.  He will continue to work on lifestyle modification we discussed this today. The patient has longstanding whitecoat hypertension.  He reports home blood pressure readings averaging in the 130s over 60s to 70s.  Continue diltiazem. No significant internal carotid artery stenosis on recent carotid duplex evaluation.  Continue risk reduction measures.  The patient appears clinically stable from a cardiovascular perspective after remote PCI.  I will see him back in 1 year and plan to continue his current medical regimen.  Labs drawn today as above.           Medication Adjustments/Labs and Tests Ordered: Current medicines are reviewed at length with the patient today.  Concerns regarding medicines are outlined above.  Orders Placed This Encounter  Procedures   Lipid panel   Hepatic function panel   EKG 12-Lead   No orders of the defined types were placed in this encounter.   Patient Instructions  Medication Instructions:  Your physician recommends that you continue on your current medications as directed. Please refer to the Current Medication list given to you today.  *If you need a refill on your cardiac medications before your next appointment, please call your pharmacy*   Lab Work: Lipids, Liver today If you have labs (blood work) drawn today and your tests are completely normal, you will receive your results only by: MyChart Message (if you have MyChart) OR A paper copy in the mail If you have any lab test that is abnormal or we need to change your treatment, we  will call you to review the  results.   Testing/Procedures: NONE   Follow-Up: At [facility], you and your health needs are our priority.  As part of our continuing mission to provide you with exceptional heart care, we have created designated Provider Care Teams.  These Care Teams include your primary Cardiologist (physician) and Advanced Practice Providers (APPs -  Physician Assistants and Nurse Practitioners) who all work together to provide you with the care you need, when you need it.  We recommend signing up for the patient portal called "MyChart".  Sign up information is provided on this After Visit Summary.  MyChart is used to connect with patients for Virtual Visits (Telemedicine).  Patients are able to view lab/test results, encounter notes, upcoming appointments, etc.  Non-urgent messages can be sent to your provider as well.   To learn more about what you can do with MyChart, go to [email or web address].    Your next appointment:   1 year(s)  Provider:   Tonny Bollman, MD        Signed, Tonny Bollman, MD  08/06/2022 8:34 AM    [facility]

## 2022-08-06 NOTE — Patient Instructions (Signed)
Medication Instructions:  Your physician recommends that you continue on your current medications as directed. Please refer to the Current Medication list given to you today.  *If you need a refill on your cardiac medications before your next appointment, please call your pharmacy*   Lab Work: Lipids, Liver today If you have labs (blood work) drawn today and your tests are completely normal, you will receive your results only by: MyChart Message (if you have MyChart) OR A paper copy in the mail If you have any lab test that is abnormal or we need to change your treatment, we will call you to review the results.   Testing/Procedures: NONE   Follow-Up: At [facility], you and your health needs are our priority.  As part of our continuing mission to provide you with exceptional heart care, we have created designated Provider Care Teams.  These Care Teams include your primary Cardiologist (physician) and Advanced Practice Providers (APPs -  Physician Assistants and Nurse Practitioners) who all work together to provide you with the care you need, when you need it.  We recommend signing up for the patient portal called "MyChart".  Sign up information is provided on this After Visit Summary.  MyChart is used to connect with patients for Virtual Visits (Telemedicine).  Patients are able to view lab/test results, encounter notes, upcoming appointments, etc.  Non-urgent messages can be sent to your provider as well.   To learn more about what you can do with MyChart, go to [email or web address].    Your next appointment:   1 year(s)  Provider:   Tonny Bollman, MD

## 2022-08-07 LAB — HEPATIC FUNCTION PANEL
ALT: 20 IU/L (ref 0–44)
AST: 19 IU/L (ref 0–40)
Albumin: 4.4 g/dL (ref 3.8–4.8)
Alkaline Phosphatase: 71 IU/L (ref 44–121)
Bilirubin Total: 0.4 mg/dL (ref 0.0–1.2)
Bilirubin, Direct: 0.1 mg/dL (ref 0.00–0.40)
Total Protein: 7.1 g/dL (ref 6.0–8.5)

## 2022-08-07 LAB — LIPID PANEL
Chol/HDL Ratio: 4.7 ratio (ref 0.0–5.0)
Cholesterol, Total: 273 mg/dL — ABNORMAL HIGH (ref 100–199)
HDL: 58 mg/dL (ref >39)
LDL Chol Calc (NIH): 176 mg/dL — ABNORMAL HIGH (ref 0–99)
Triglycerides: 212 mg/dL — ABNORMAL HIGH (ref 0–149)
VLDL Cholesterol Cal: 39 mg/dL (ref 5–40)

## 2022-08-13 DIAGNOSIS — D519 Vitamin B12 deficiency anemia, unspecified: Secondary | ICD-10-CM | POA: Diagnosis not present

## 2022-09-10 ENCOUNTER — Other Ambulatory Visit: Payer: Self-pay

## 2022-09-10 DIAGNOSIS — D519 Vitamin B12 deficiency anemia, unspecified: Secondary | ICD-10-CM | POA: Diagnosis not present

## 2022-09-10 MED ORDER — EZETIMIBE 10 MG PO TABS: 10.0000 mg | ORAL_TABLET | Freq: Every day | ORAL | 3 refills | Status: DC

## 2022-09-19 DIAGNOSIS — L820 Inflamed seborrheic keratosis: Secondary | ICD-10-CM | POA: Diagnosis not present

## 2022-09-19 DIAGNOSIS — C44319 Basal cell carcinoma of skin of other parts of face: Secondary | ICD-10-CM | POA: Diagnosis not present

## 2022-09-19 DIAGNOSIS — D485 Neoplasm of uncertain behavior of skin: Secondary | ICD-10-CM | POA: Diagnosis not present

## 2022-10-08 DIAGNOSIS — D519 Vitamin B12 deficiency anemia, unspecified: Secondary | ICD-10-CM | POA: Diagnosis not present

## 2022-10-15 DIAGNOSIS — Z6830 Body mass index (BMI) 30.0-30.9, adult: Secondary | ICD-10-CM | POA: Diagnosis not present

## 2022-10-15 DIAGNOSIS — L255 Unspecified contact dermatitis due to plants, except food: Secondary | ICD-10-CM | POA: Diagnosis not present

## 2022-11-12 DIAGNOSIS — D519 Vitamin B12 deficiency anemia, unspecified: Secondary | ICD-10-CM | POA: Diagnosis not present

## 2022-12-06 DIAGNOSIS — M79672 Pain in left foot: Secondary | ICD-10-CM | POA: Diagnosis not present

## 2022-12-06 DIAGNOSIS — L84 Corns and callosities: Secondary | ICD-10-CM | POA: Diagnosis not present

## 2022-12-13 DIAGNOSIS — D519 Vitamin B12 deficiency anemia, unspecified: Secondary | ICD-10-CM | POA: Diagnosis not present

## 2023-01-14 DIAGNOSIS — D519 Vitamin B12 deficiency anemia, unspecified: Secondary | ICD-10-CM | POA: Diagnosis not present

## 2023-01-29 DIAGNOSIS — R350 Frequency of micturition: Secondary | ICD-10-CM | POA: Diagnosis not present

## 2023-01-29 DIAGNOSIS — I10 Essential (primary) hypertension: Secondary | ICD-10-CM | POA: Diagnosis not present

## 2023-01-29 DIAGNOSIS — Z6830 Body mass index (BMI) 30.0-30.9, adult: Secondary | ICD-10-CM | POA: Diagnosis not present

## 2023-01-29 DIAGNOSIS — N401 Enlarged prostate with lower urinary tract symptoms: Secondary | ICD-10-CM | POA: Diagnosis not present

## 2023-02-26 DIAGNOSIS — N401 Enlarged prostate with lower urinary tract symptoms: Secondary | ICD-10-CM | POA: Diagnosis not present

## 2023-02-26 DIAGNOSIS — R351 Nocturia: Secondary | ICD-10-CM | POA: Diagnosis not present

## 2023-02-26 DIAGNOSIS — M25551 Pain in right hip: Secondary | ICD-10-CM | POA: Diagnosis not present

## 2023-03-17 ENCOUNTER — Other Ambulatory Visit: Payer: Self-pay | Admitting: Cardiovascular Disease

## 2023-08-11 NOTE — Progress Notes (Unsigned)
 Cardiology Office Note:    Date:  08/12/2023   ID:  Christopher Perez, Christopher Perez Aug 02, 1945, MRN [id number]  PCP:  Russell Court, DO   [facility] Providers Cardiologist:  Arnoldo Lapping, MD     Referring MD: Madelyne Schiff, MD   Chief Complaint  Patient presents with   Coronary Artery Disease    History of Present Illness:    Christopher Perez is a 78 y.o. male with a hx of coronary artery disease, hypertension, mixed hyperlipidemia, presenting for follow-up evaluation. The patient initially presented in 2007 with exertional angina, was found to have high risk features on a nuclear stress test, and underwent PCI to treat critical stenosis in a large OM branch of the circumflex. He has had no recurrent ischemic events since that time. Unfortunately he has been intolerant to all classes of lipid-lowering therapies other than zetia .   The patient is here alone today. He brings in home blood pressure readings that demonstrate excellent BP control, 119-134/65-74 mmHg. He is doing well from a cardiac standpoint. He denies chest pain, chest pressure, edema, or heart palpitations. He is having no chest pain like his prior angina, but reports some heartburn. He has a lot of problems with joint aches, muscle aches, and low back pain. All of this is longstanding. He has L4/L5 fusion in the past but didn't have much symptomatic improvement with this.    Current Medications: Current Meds  Medication Sig   ascorbic acid (VITAMIN C) 1000 MG tablet Take 1,000 mg by mouth daily. Unknown start date   aspirin  EC 81 MG tablet Take 1 tablet (81 mg total) by mouth daily. Swallow whole.   celecoxib (CELEBREX) 200 MG capsule Take 200 mg by mouth as needed.   dicyclomine (BENTYL) 20 MG tablet Take 20 mg by mouth as needed for spasms (STOMACH).   ezetimibe  (ZETIA ) 10 MG tablet Take 1 tablet (10 mg total) by mouth daily.   magnesium gluconate (MAGONATE) 500 MG tablet Take 500 mg by mouth daily.  Per patient taking 400 mg daily   tadalafil  (CIALIS ) 5 MG tablet Take 5 mg by mouth every morning.   zinc  gluconate 50 MG tablet Take 50 mg by mouth daily.   [DISCONTINUED] Cyanocobalamin  (B-12 COMPLIANCE INJECTION) 1000 MCG/ML KIT Inject as directed See admin instructions.   [DISCONTINUED] diltiazem  (CARDIZEM  CD) 120 MG 24 hr capsule Take 1 capsule by mouth once daily     Allergies:   Other, Statins, Evolocumab , and Praluent  [alirocumab ]   ROS:   Please see the history of present illness.    All other systems reviewed and are negative.  EKGs/Labs/Other Studies Reviewed:    The following studies were reviewed today: Cardiac Studies & Procedures   ______________________________________________________________________________________________   STRESS TESTS  MYOCARDIAL PERFUSION IMAGING 08/01/2021  Narrative   The study is normal. The study is low risk.   No ST deviation was noted.   Left ventricular function is normal. Nuclear stress EF: 67 %. The left ventricular ejection fraction is hyperdynamic (>65%). End diastolic cavity size is normal.   Prior study not available for comparison.  Moderate size and intensity inferior lesion worse at rest; consistent with diaphragmatic attenuation artifact            ______________________________________________________________________________________________      EKG:   EKG Interpretation Date/Time:  Monday Aug 12 2023 08:48:48 EDT Ventricular Rate:  52 PR Interval:  192 QRS Duration:  100 QT Interval:  446 QTC Calculation: 414  R Axis:   46  Text Interpretation: Sinus bradycardia When compared with ECG of 14-Jul-2021 12:40, PREVIOUS ECG IS PRESENT No significant change was found Confirmed by Arnoldo Lapping [phone]) on 08/12/2023 8:58:54 AM    Recent Labs: No results found for requested labs within last 365 days.  Recent Lipid Panel    Component Value Date/Time   CHOL 273 (H) 08/06/2022 0832   TRIG 212 (H) 08/06/2022 0832    HDL 58 08/06/2022 0832   CHOLHDL 4.7 08/06/2022 0832   CHOLHDL 3.1 11/11/2015 1050   VLDL 36 (H) 11/11/2015 1050   LDLCALC 176 (H) 08/06/2022 0832   LDLDIRECT 188.0 11/09/2014 0734           Physical Exam:    VS:  BP (!) 154/90 Comment: Right arm 144/80  Pulse (!) 52   Ht 5\' 10"  (1.778 m)   Wt 212 lb 9.6 oz (96.4 kg)   SpO2 96%   BMI 30.50 kg/m     Wt Readings from Last 3 Encounters:  08/12/23 212 lb 9.6 oz (96.4 kg)  08/06/22 216 lb 6.4 oz (98.2 kg)  03/13/22 213 lb (96.6 kg)     GEN:  Well nourished, well developed in no acute distress HEENT: Normal NECK: No JVD; No carotid bruits LYMPHATICS: No lymphadenopathy CARDIAC: RRR, no murmurs, rubs, gallops RESPIRATORY:  Clear to auscultation without rales, wheezing or rhonchi  ABDOMEN: Soft, non-tender, non-distended MUSCULOSKELETAL:  No edema; No deformity  SKIN: Warm and dry NEUROLOGIC:  Alert and oriented x 3 PSYCHIATRIC:  Normal affect   Assessment & Plan Coronary artery disease involving native coronary artery of native heart with other form of angina pectoris (HCC) The patient is clinically stable on low-dose aspirin  and Zetia .  He unfortunately cannot take any other lipid-lowering therapies and he has tried multiple statins, Livalo, low-dose pravastatin, Repatha , and Praluent  without being able to tolerate any of them.  Continue current therapy.  He is having some heartburn but no typical symptoms of angina.  His stress Myoview  scan from 2023 is reviewed and showed no ischemia. Mixed hyperlipidemia Treated with Zetia  alone.  Intolerant to all other lipid-lowering therapies.  Is not interested in pursuing inclisiran due to his intolerances of all other medicines and concerns about its long half-life. Essential hypertension Home blood pressure readings are excellent.  He stopped diltiazem .  His readings off of diltiazem  are reflected above in the HPI and show excellent control.  He will work on diet and lifestyle  modification we discussed specific strategies today.       Medication Adjustments/Labs and Tests Ordered: Current medicines are reviewed at length with the patient today.  Concerns regarding medicines are outlined above.  Orders Placed This Encounter  Procedures   EKG 12-Lead   No orders of the defined types were placed in this encounter.   Patient Instructions  Follow-Up: At [facility], you and your health needs are our priority.  As part of our continuing mission to provide you with exceptional heart care, our providers are all part of one team.  This team includes your primary Cardiologist (physician) and Advanced Practice Providers or APPs (Physician Assistants and Nurse Practitioners) who all work together to provide you with the care you need, when you need it.  Your next appointment:   1 year(s)  Provider:   Arnoldo Lapping, MD       Signed, Arnoldo Lapping, MD  08/12/2023 9:14 AM    [facility]

## 2023-08-12 ENCOUNTER — Encounter: Payer: Self-pay | Admitting: Cardiovascular Disease

## 2023-08-12 ENCOUNTER — Ambulatory Visit: Payer: PPO | Attending: Cardiovascular Disease | Admitting: Cardiovascular Disease

## 2023-08-12 VITALS — BP 154/90 | HR 52 | Ht 70.0 in | Wt 212.6 lb

## 2023-08-12 DIAGNOSIS — I10 Essential (primary) hypertension: Secondary | ICD-10-CM | POA: Diagnosis not present

## 2023-08-12 DIAGNOSIS — I25118 Atherosclerotic heart disease of native coronary artery with other forms of angina pectoris: Secondary | ICD-10-CM | POA: Diagnosis not present

## 2023-08-12 DIAGNOSIS — E782 Mixed hyperlipidemia: Secondary | ICD-10-CM

## 2023-08-12 NOTE — Patient Instructions (Signed)
 Follow-Up: At [facility], you and your health needs are our priority.  As part of our continuing mission to provide you with exceptional heart care, our providers are all part of one team.  This team includes your primary Cardiologist (physician) and Advanced Practice Providers or APPs (Physician Assistants and Nurse Practitioners) who all work together to provide you with the care you need, when you need it.  Your next appointment:   1 year(s)  Provider:   Arnoldo Lapping, MD

## 2023-08-12 NOTE — Assessment & Plan Note (Signed)
 Home blood pressure readings are excellent.  He stopped diltiazem .  His readings off of diltiazem  are reflected above in the HPI and show excellent control.  He will work on diet and lifestyle modification we discussed specific strategies today.

## 2023-12-17 ENCOUNTER — Other Ambulatory Visit: Payer: Self-pay | Admitting: Cardiovascular Disease

## 2024-01-05 IMAGING — CR DG CHEST 2V
2 series · 2 of 2 positions shown · non-contrast
Comparison: 03/11/2006

CLINICAL DATA: Shortness of breath

EXAM:
CHEST - 2 VIEW

[w chest pa]
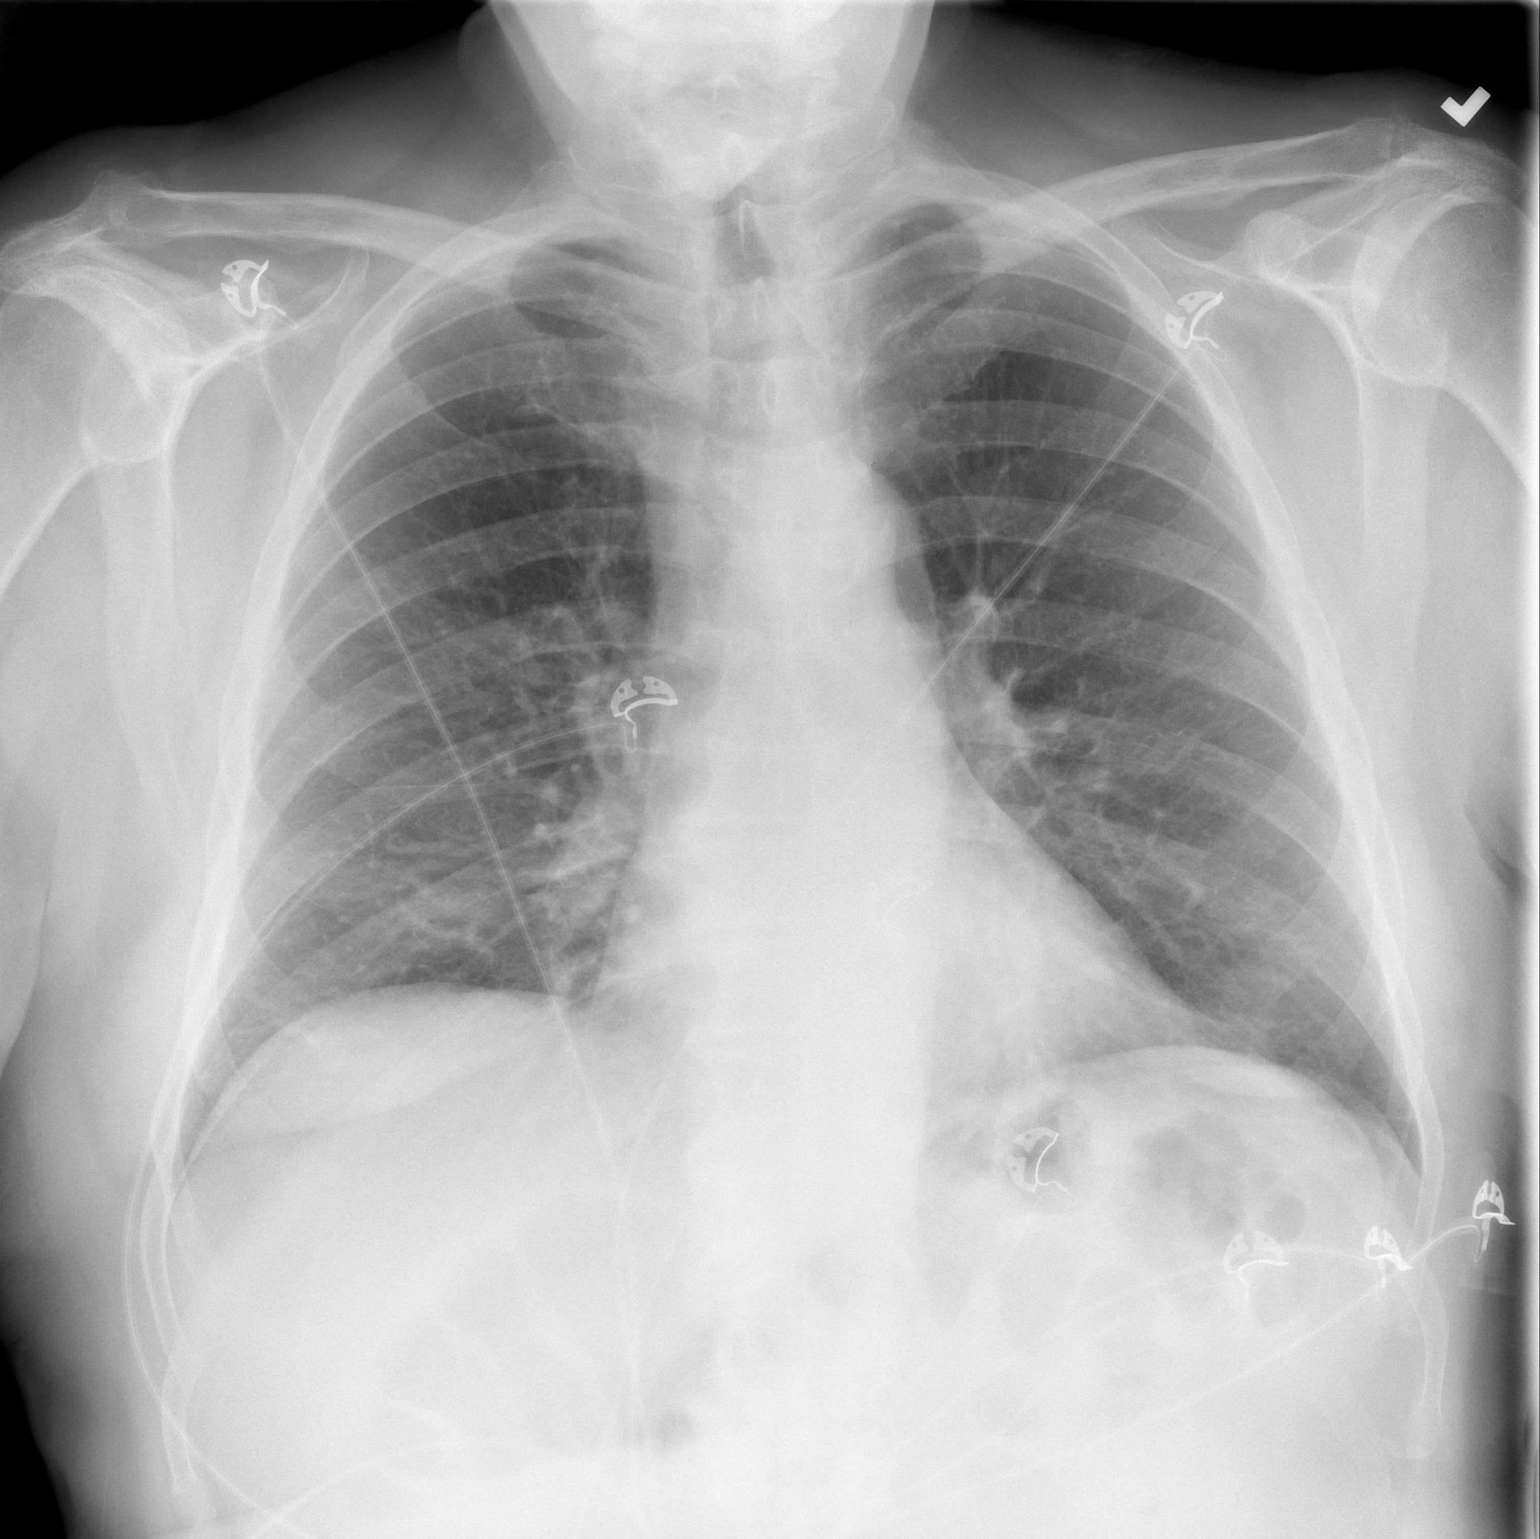

[w chest lat]
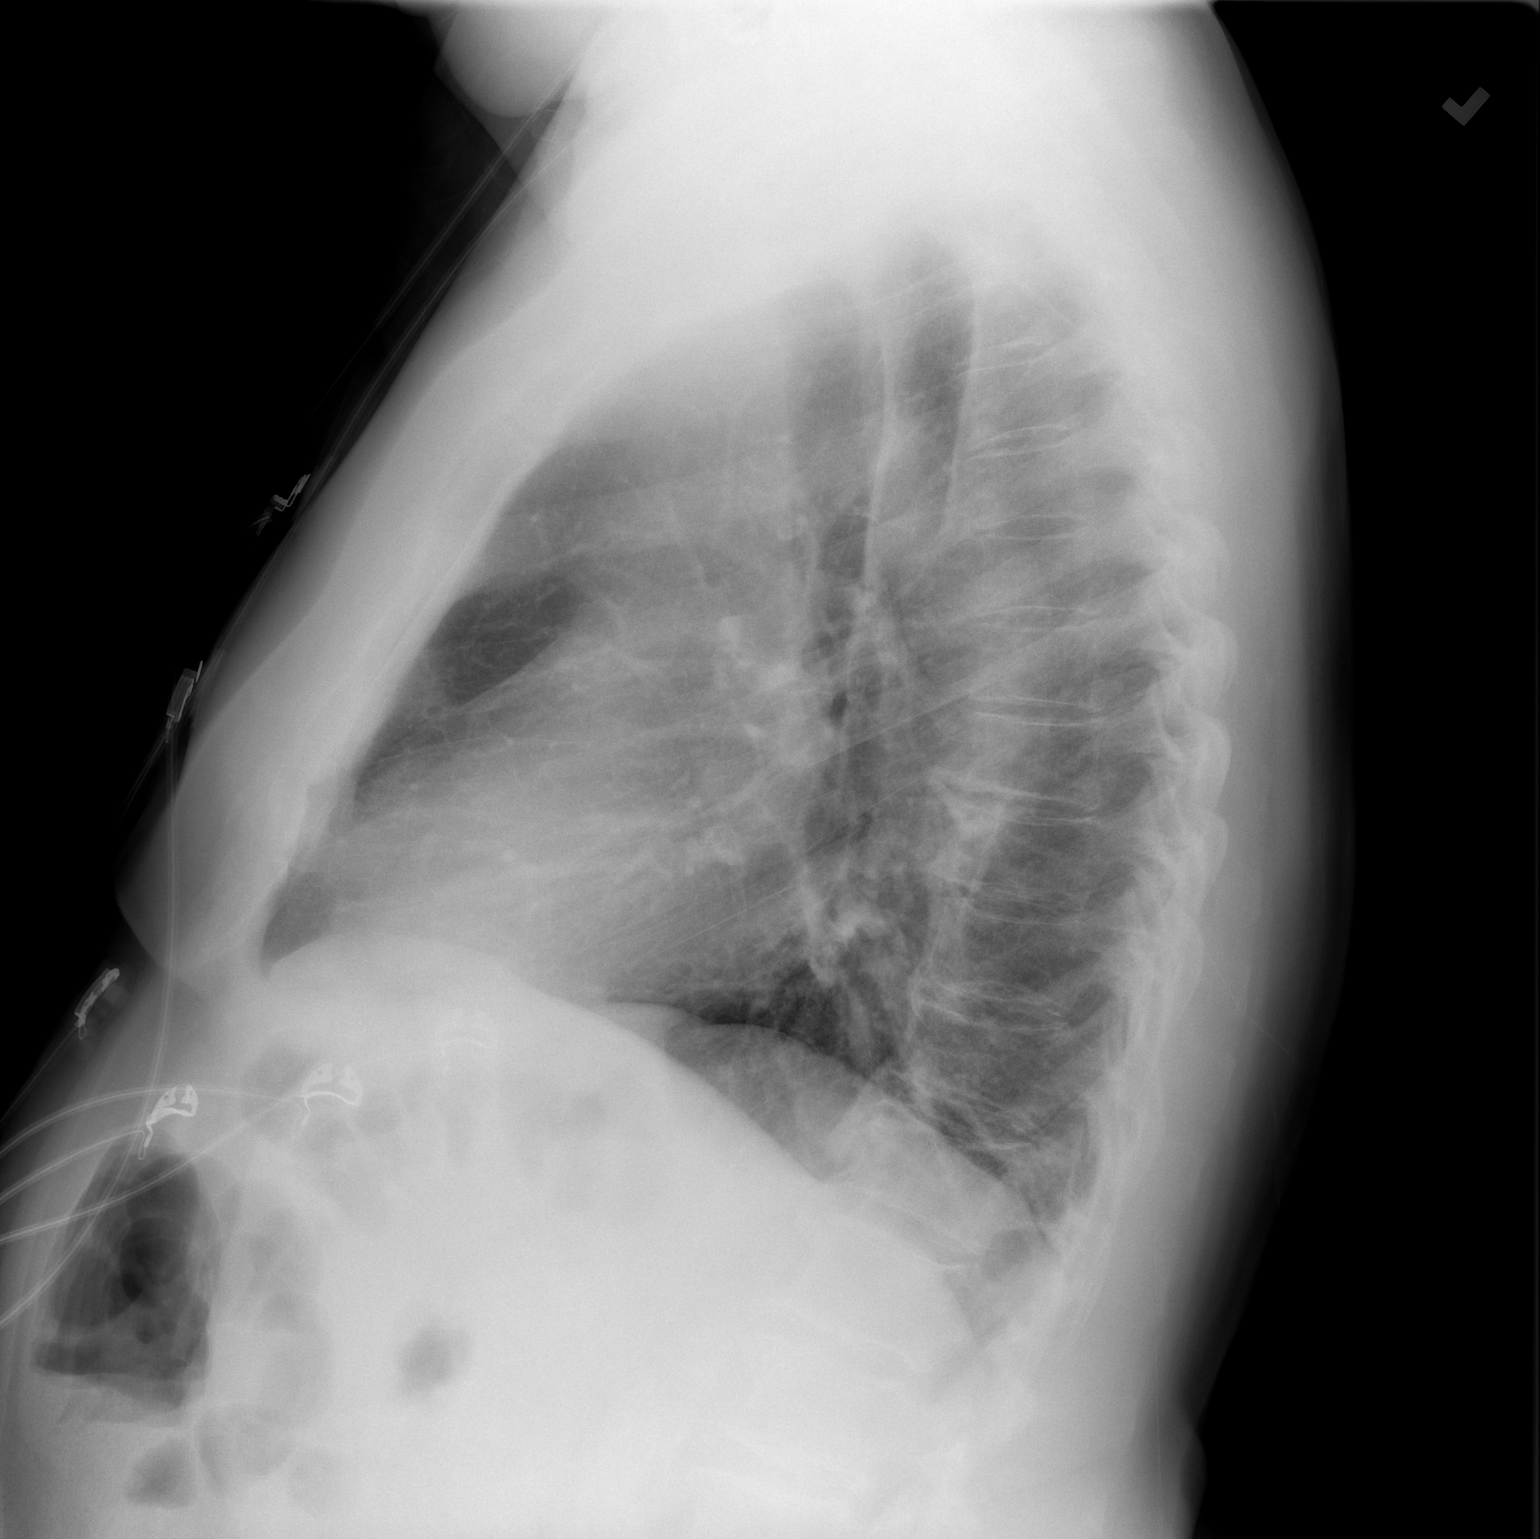

[2 of 2 positions shown; findings below may reference images not displayed]

FINDINGS: The heart size and mediastinal contours are within normal limits.
Both lungs are clear. The visualized skeletal structures are
unremarkable.
IMPRESSION: No active cardiopulmonary disease.

## 2024-03-16 ENCOUNTER — Other Ambulatory Visit: Payer: Self-pay | Admitting: Cardiovascular Disease

## 2024-03-16 ENCOUNTER — Other Ambulatory Visit (HOSPITAL_BASED_OUTPATIENT_CLINIC_OR_DEPARTMENT_OTHER): Payer: Self-pay

## 2024-03-16 MED FILL — Ezetimibe Tab 10 MG: ORAL | 90 days supply | Qty: 90 | Fill #0 | Status: AC

## 2024-03-19 ENCOUNTER — Other Ambulatory Visit (HOSPITAL_BASED_OUTPATIENT_CLINIC_OR_DEPARTMENT_OTHER): Payer: Self-pay

## 2024-03-20 ENCOUNTER — Other Ambulatory Visit (HOSPITAL_BASED_OUTPATIENT_CLINIC_OR_DEPARTMENT_OTHER): Payer: Self-pay

## 2024-03-20 ENCOUNTER — Other Ambulatory Visit: Payer: Self-pay | Admitting: Cardiovascular Disease

## 2024-03-30 ENCOUNTER — Other Ambulatory Visit (HOSPITAL_BASED_OUTPATIENT_CLINIC_OR_DEPARTMENT_OTHER): Payer: Self-pay

## 2024-03-30 MED ORDER — FLUZONE HIGH-DOSE 0.5 ML IM SUSY
0.5000 mL | PREFILLED_SYRINGE | Freq: Once | INTRAMUSCULAR | 0 refills | Status: AC
  Filled 2024-03-30: qty 0.5, 1d supply, fill #0
